# Patient Record
Sex: Female | Born: 1955
Health system: Southern US, Community
[De-identification: ages and names within clinical notes are randomized; demographics above are authoritative.]

## PROBLEM LIST (undated history)

## (undated) DIAGNOSIS — E079 Disorder of thyroid, unspecified: Secondary | ICD-10-CM

## (undated) DIAGNOSIS — E039 Hypothyroidism, unspecified: Secondary | ICD-10-CM

## (undated) DIAGNOSIS — R319 Hematuria, unspecified: Secondary | ICD-10-CM

## (undated) HISTORY — DX: Hypothyroidism, unspecified: E03.9

## (undated) HISTORY — DX: Disorder of thyroid, unspecified: E07.9

## (undated) HISTORY — DX: Hematuria, unspecified: R31.9

## (undated) HISTORY — PX: EYE SURGERY: SHX253

---

## 2001-11-14 ENCOUNTER — Ambulatory Visit (HOSPITAL_COMMUNITY): Admission: RE | Admit: 2001-11-14 | Discharge: 2001-11-14 | Payer: Self-pay | Admitting: Obstetrics and Gynecology

## 2001-11-14 ENCOUNTER — Encounter: Payer: Self-pay | Admitting: Obstetrics and Gynecology

## 2001-11-21 ENCOUNTER — Encounter: Payer: Self-pay | Admitting: Obstetrics and Gynecology

## 2001-11-21 ENCOUNTER — Ambulatory Visit (HOSPITAL_COMMUNITY): Admission: RE | Admit: 2001-11-21 | Discharge: 2001-11-21 | Payer: Self-pay | Admitting: Obstetrics and Gynecology

## 2001-11-26 ENCOUNTER — Other Ambulatory Visit: Admission: RE | Admit: 2001-11-26 | Discharge: 2001-11-26 | Payer: Self-pay | Admitting: Obstetrics and Gynecology

## 2002-12-11 ENCOUNTER — Encounter: Payer: Self-pay | Admitting: Specialist

## 2002-12-11 ENCOUNTER — Ambulatory Visit (HOSPITAL_COMMUNITY): Admission: RE | Admit: 2002-12-11 | Discharge: 2002-12-11 | Payer: Self-pay | Admitting: Specialist

## 2003-12-24 ENCOUNTER — Ambulatory Visit (HOSPITAL_COMMUNITY): Admission: RE | Admit: 2003-12-24 | Discharge: 2003-12-24 | Payer: Self-pay | Admitting: Obstetrics and Gynecology

## 2004-01-06 ENCOUNTER — Ambulatory Visit (HOSPITAL_COMMUNITY): Admission: RE | Admit: 2004-01-06 | Discharge: 2004-01-06 | Payer: Self-pay | Admitting: Obstetrics and Gynecology

## 2005-01-06 ENCOUNTER — Ambulatory Visit (HOSPITAL_COMMUNITY): Admission: RE | Admit: 2005-01-06 | Discharge: 2005-01-06 | Payer: Self-pay | Admitting: Obstetrics and Gynecology

## 2006-03-15 ENCOUNTER — Ambulatory Visit (HOSPITAL_COMMUNITY): Admission: RE | Admit: 2006-03-15 | Discharge: 2006-03-15 | Payer: Self-pay | Admitting: Obstetrics and Gynecology

## 2007-04-15 ENCOUNTER — Ambulatory Visit (HOSPITAL_COMMUNITY): Admission: RE | Admit: 2007-04-15 | Discharge: 2007-04-15 | Payer: Self-pay | Admitting: Obstetrics and Gynecology

## 2008-04-23 ENCOUNTER — Ambulatory Visit (HOSPITAL_COMMUNITY): Admission: RE | Admit: 2008-04-23 | Discharge: 2008-04-23 | Payer: Self-pay | Admitting: Obstetrics and Gynecology

## 2008-06-10 ENCOUNTER — Other Ambulatory Visit: Admission: RE | Admit: 2008-06-10 | Discharge: 2008-06-10 | Payer: Self-pay | Admitting: Obstetrics and Gynecology

## 2009-04-26 ENCOUNTER — Ambulatory Visit (HOSPITAL_COMMUNITY): Admission: RE | Admit: 2009-04-26 | Discharge: 2009-04-26 | Payer: Self-pay | Admitting: Obstetrics and Gynecology

## 2010-04-29 ENCOUNTER — Ambulatory Visit (HOSPITAL_COMMUNITY): Admission: RE | Admit: 2010-04-29 | Discharge: 2010-04-29 | Payer: Self-pay | Admitting: Obstetrics and Gynecology

## 2010-06-15 ENCOUNTER — Other Ambulatory Visit: Admission: RE | Admit: 2010-06-15 | Discharge: 2010-06-15 | Payer: Self-pay | Admitting: Obstetrics and Gynecology

## 2011-02-22 ENCOUNTER — Encounter (HOSPITAL_BASED_OUTPATIENT_CLINIC_OR_DEPARTMENT_OTHER): Payer: BC Managed Care – PPO | Admitting: Internal Medicine

## 2011-02-22 ENCOUNTER — Ambulatory Visit (HOSPITAL_COMMUNITY)
Admission: RE | Admit: 2011-02-22 | Discharge: 2011-02-22 | Disposition: A | Discharge status: Home / Self Care | Payer: BC Managed Care – PPO | Source: Ambulatory Visit | Attending: Internal Medicine | Admitting: Internal Medicine

## 2011-02-22 DIAGNOSIS — Z1211 Encounter for screening for malignant neoplasm of colon: Secondary | ICD-10-CM

## 2011-02-22 DIAGNOSIS — K644 Residual hemorrhoidal skin tags: Secondary | ICD-10-CM

## 2011-03-12 NOTE — Op Note (Signed)
  NAME:  Diana Gregory, Diana Gregory NO.:  [id number]  MEDICAL RECORD NO.:  [id number]           PATIENT TYPE:  O  LOCATION:  DAYP                          FACILITY:  [facility]  PHYSICIAN:  Lionel December, M.D.    DATE OF BIRTH:  1956-08-16  DATE OF PROCEDURE:  02/22/2011 DATE OF DISCHARGE:                              OPERATIVE REPORT   PROCEDURE:  Colonoscopy.  INDICATIONS:  Laquinta is 55 year old Caucasian female who is undergoing average risk screening exam.  This is her first exam.  Procedures were reviewed with the patient.  Informed consent was obtained.  MEDS FOR CONSCIOUS SEDATION: 1. Demerol 25 mg IV. 2. Versed 4 mg IV.  FINDINGS:  Procedure performed in endoscopy suite.  The patient's vital signs and O2 sat were monitored during the procedure and remained stable.  The patient was placed in left lateral recumbent position and rectal examination performed.  No abnormality noted on external or digital exam.  Pentax videoscope was placed through rectum and advanced under vision into sigmoid colon and beyond.  Preparation was excellent. Scope was passed into cecum which was identified by appendiceal orifice and ileocecal valve.  Pictures were taken for the record.  As the scope was withdrawn, colonic mucosa was carefully examined.  It was normal throughout.  Rectal mucosa similarly was normal.  Scope was retroflexed to examine anorectal junction and hemorrhoids noted below the dentate line with focal erythema.  Endoscope was withdrawn.  Withdrawal time was 7 minutes.  The patient tolerated the procedure well.  FINAL DIAGNOSES: 1. Examination performed to cecum. 2. Normal colonoscopy except external hemorrhoids.  RECOMMENDATIONS: 1. Standard instructions given. 2. Yearly Hemoccults. 3. Consider next screening exam in 10 years.          ______________________________ Lionel December, M.D.     NR/MEDQ  D:  02/22/2011  T:  02/22/2011  Job:  [id number]  cc:   Kirk Ruths, M.D. Fax: [phone]  Cyril Mourning, NP  Electronically Signed by Lionel December M.D. on 03/12/2011 01:05:24 PM

## 2011-05-29 ENCOUNTER — Other Ambulatory Visit: Payer: Self-pay | Admitting: Obstetrics and Gynecology

## 2011-05-29 DIAGNOSIS — Z139 Encounter for screening, unspecified: Secondary | ICD-10-CM

## 2011-06-01 ENCOUNTER — Ambulatory Visit (HOSPITAL_COMMUNITY)
Admission: RE | Admit: 2011-06-01 | Discharge: 2011-06-01 | Disposition: A | Discharge status: Home / Self Care | Payer: BC Managed Care – PPO | Source: Ambulatory Visit | Attending: Obstetrics and Gynecology | Admitting: Obstetrics and Gynecology

## 2011-06-01 DIAGNOSIS — Z1231 Encounter for screening mammogram for malignant neoplasm of breast: Secondary | ICD-10-CM | POA: Insufficient documentation

## 2011-06-01 DIAGNOSIS — Z139 Encounter for screening, unspecified: Secondary | ICD-10-CM

## 2011-06-26 ENCOUNTER — Other Ambulatory Visit: Payer: Self-pay | Admitting: Adult Health

## 2011-07-04 ENCOUNTER — Other Ambulatory Visit: Payer: Self-pay | Admitting: Adult Health

## 2011-07-04 ENCOUNTER — Other Ambulatory Visit (HOSPITAL_COMMUNITY)
Admission: RE | Admit: 2011-07-04 | Discharge: 2011-07-04 | Disposition: A | Discharge status: Home / Self Care | Payer: BC Managed Care – PPO | Source: Ambulatory Visit | Attending: Obstetrics and Gynecology | Admitting: Obstetrics and Gynecology

## 2011-07-04 DIAGNOSIS — Z01419 Encounter for gynecological examination (general) (routine) without abnormal findings: Secondary | ICD-10-CM | POA: Insufficient documentation

## 2012-04-30 ENCOUNTER — Other Ambulatory Visit: Payer: Self-pay | Admitting: Adult Health

## 2012-04-30 DIAGNOSIS — Z139 Encounter for screening, unspecified: Secondary | ICD-10-CM

## 2012-06-04 ENCOUNTER — Ambulatory Visit (HOSPITAL_COMMUNITY)
Admission: RE | Admit: 2012-06-04 | Discharge: 2012-06-04 | Disposition: A | Discharge status: Home / Self Care | Payer: BC Managed Care – PPO | Source: Ambulatory Visit | Attending: Adult Health | Admitting: Adult Health

## 2012-06-04 DIAGNOSIS — Z1231 Encounter for screening mammogram for malignant neoplasm of breast: Secondary | ICD-10-CM | POA: Insufficient documentation

## 2012-06-04 DIAGNOSIS — Z139 Encounter for screening, unspecified: Secondary | ICD-10-CM

## 2012-07-19 ENCOUNTER — Other Ambulatory Visit: Payer: Self-pay | Admitting: Adult Health

## 2012-07-19 ENCOUNTER — Other Ambulatory Visit (HOSPITAL_COMMUNITY)
Admission: RE | Admit: 2012-07-19 | Discharge: 2012-07-19 | Disposition: A | Discharge status: Home / Self Care | Payer: BC Managed Care – PPO | Source: Ambulatory Visit | Attending: Obstetrics and Gynecology | Admitting: Obstetrics and Gynecology

## 2012-07-19 DIAGNOSIS — Z1151 Encounter for screening for human papillomavirus (HPV): Secondary | ICD-10-CM | POA: Insufficient documentation

## 2012-07-19 DIAGNOSIS — Z01419 Encounter for gynecological examination (general) (routine) without abnormal findings: Secondary | ICD-10-CM | POA: Insufficient documentation

## 2013-05-12 ENCOUNTER — Other Ambulatory Visit: Payer: Self-pay | Admitting: Adult Health

## 2013-05-12 DIAGNOSIS — Z139 Encounter for screening, unspecified: Secondary | ICD-10-CM

## 2013-06-05 ENCOUNTER — Ambulatory Visit (HOSPITAL_COMMUNITY)
Admission: RE | Admit: 2013-06-05 | Discharge: 2013-06-05 | Disposition: A | Discharge status: Home / Self Care | Payer: BC Managed Care – PPO | Source: Ambulatory Visit | Attending: Adult Health | Admitting: Adult Health

## 2013-06-05 DIAGNOSIS — Z1231 Encounter for screening mammogram for malignant neoplasm of breast: Secondary | ICD-10-CM | POA: Insufficient documentation

## 2013-06-05 DIAGNOSIS — Z139 Encounter for screening, unspecified: Secondary | ICD-10-CM

## 2013-07-28 ENCOUNTER — Ambulatory Visit (INDEPENDENT_AMBULATORY_CARE_PROVIDER_SITE_OTHER): Payer: BC Managed Care – PPO | Admitting: Adult Health

## 2013-07-28 ENCOUNTER — Encounter: Payer: Self-pay | Admitting: Adult Health

## 2013-07-28 ENCOUNTER — Encounter (INDEPENDENT_AMBULATORY_CARE_PROVIDER_SITE_OTHER): Payer: Self-pay

## 2013-07-28 VITALS — BP 110/70 | HR 72 | Ht 63.5 in | Wt 127.0 lb

## 2013-07-28 DIAGNOSIS — Z1212 Encounter for screening for malignant neoplasm of rectum: Secondary | ICD-10-CM

## 2013-07-28 DIAGNOSIS — E039 Hypothyroidism, unspecified: Secondary | ICD-10-CM

## 2013-07-28 DIAGNOSIS — Z01419 Encounter for gynecological examination (general) (routine) without abnormal findings: Secondary | ICD-10-CM

## 2013-07-28 HISTORY — DX: Hypothyroidism, unspecified: E03.9

## 2013-07-28 LAB — HEMOCCULT GUIAC POC 1CARD (OFFICE)

## 2013-07-28 NOTE — Progress Notes (Signed)
Patient ID: Diana Gregory, female   DOB: 1956/10/08, 57 y.o.   MRN: [id number] History of Present Illness: Diana Gregory is a 57 year old white female married in for physical, she had a normal pap with negative HPV 07/19/12.   Current Medications, Allergies, Past Medical History, Past Surgical History, Family History and Social History were reviewed in [employer] record.     Review of Systems: Patient denies any headaches, blurred vision, shortness of breath, chest pain, abdominal pain, problems with bowel movements, urination, or intercourse. No joint pain or mood swings.    Physical Exam:BP 110/70  Pulse 72  Ht 5' 3.5" (1.613 m)  Wt 127 lb (57.607 kg)  BMI 22.14 kg/m2 General:  Well developed, well nourished, no acute distress Skin:  Warm and dry Neck:  Midline trachea, normal thyroid Lungs; Clear to auscultation bilaterally Breast:  No dominant palpable mass, retraction, or nipple discharge Cardiovascular: Regular rate and rhythm Abdomen:  Soft, non tender, no hepatosplenomegaly Pelvic:  External genitalia is normal in appearance.  The vagina is normal in appearance. The cervix is slightly atrophic.  Uterus is felt to be normal size, shape, and contour.  No  adnexal masses or tenderness noted. Rectal: Good sphincter tone, no polyps, or hemorrhoids felt.  Hemoccult negative. Extremities:  No swelling or varicosities noted Psych:  No mood changes, alert and cooperative seems happy   Impression: Yearly gyn exam no pap Hypothyroid     Plan: Physical in 1 year Mammogram yearly Colonoscopy per GI Get fasting labs this week CBC,CMP,TSH and lipids

## 2013-07-28 NOTE — Patient Instructions (Signed)
Physical in 1 year Mammogram yearly Labs in near future colonoscopy as per GI Get flu shot

## 2013-08-12 ENCOUNTER — Other Ambulatory Visit: Payer: Self-pay | Admitting: Adult Health

## 2014-06-10 ENCOUNTER — Other Ambulatory Visit: Payer: Self-pay | Admitting: Adult Health

## 2014-06-10 DIAGNOSIS — Z1231 Encounter for screening mammogram for malignant neoplasm of breast: Secondary | ICD-10-CM

## 2014-06-17 ENCOUNTER — Ambulatory Visit (HOSPITAL_COMMUNITY)
Admission: RE | Admit: 2014-06-17 | Discharge: 2014-06-17 | Disposition: A | Discharge status: Home / Self Care | Payer: BC Managed Care – PPO | Source: Ambulatory Visit | Attending: Adult Health | Admitting: Adult Health

## 2014-06-17 DIAGNOSIS — Z1231 Encounter for screening mammogram for malignant neoplasm of breast: Secondary | ICD-10-CM | POA: Insufficient documentation

## 2014-08-03 ENCOUNTER — Ambulatory Visit (INDEPENDENT_AMBULATORY_CARE_PROVIDER_SITE_OTHER): Payer: BC Managed Care – PPO | Admitting: Adult Health

## 2014-08-03 ENCOUNTER — Encounter: Payer: Self-pay | Admitting: Adult Health

## 2014-08-03 VITALS — BP 98/60 | HR 74 | Ht 64.0 in | Wt 118.0 lb

## 2014-08-03 DIAGNOSIS — E038 Other specified hypothyroidism: Secondary | ICD-10-CM

## 2014-08-03 DIAGNOSIS — Z01419 Encounter for gynecological examination (general) (routine) without abnormal findings: Secondary | ICD-10-CM

## 2014-08-03 DIAGNOSIS — Z1212 Encounter for screening for malignant neoplasm of rectum: Secondary | ICD-10-CM

## 2014-08-03 LAB — HEMOCCULT GUIAC POC 1CARD (OFFICE): FECAL OCCULT BLD: NEGATIVE

## 2014-08-03 LAB — CBC
HCT: 41.3 % (ref 36.0–46.0)
Hemoglobin: 13.7 g/dL (ref 12.0–15.0)
MCH: 30.4 pg (ref 26.0–34.0)
MCHC: 33.2 g/dL (ref 30.0–36.0)
MCV: 91.8 fL (ref 78.0–100.0)
Platelets: 294 10*3/uL (ref 150–400)
RBC: 4.5 MIL/uL (ref 3.87–5.11)
RDW: 13.9 % (ref 11.5–15.5)
WBC: 7.3 10*3/uL (ref 4.0–10.5)

## 2014-08-03 NOTE — Progress Notes (Signed)
Patient ID: Diana Gregory, female   DOB: 07-14-1956, 58 y.o.   MRN: [id number] History of Present Illness: Diana Gregory is a 58 year old white female,married in for a gyn physical.No complaints.Had normal pap with negative HPV 07/19/12.   Current Medications, Allergies, Past Medical History, Past Surgical History, Family History and Social History were reviewed in [employer] record.     Review of Systems: Patient denies any headaches, blurred vision, shortness of breath, chest pain, abdominal pain, problems with bowel movements, urination, or intercourse. No joint pain or mood swings.    Physical Exam:BP 98/60  Pulse 74  Ht 5\' 4"  (1.626 m)  Wt 118 lb (53.524 kg)  BMI 20.24 kg/m2 General:  Well developed, well nourished, no acute distress Skin:  Warm and dry Neck:  Midline trachea, normal thyroid Lungs; Clear to auscultation bilaterally Breast:  No dominant palpable mass, retraction, or nipple discharge Cardiovascular: Regular rate and rhythm Abdomen:  Soft, non tender, no hepatosplenomegaly Pelvic:  External genitalia is normal in appearance.  The vagina has decreased color,moisture and rugae. The cervix is smooth and stenotic.Marland Kitchen  Uterus is felt to be normal size, shape, and contour.  No       adnexal masses or tenderness noted. Rectal: Good sphincter tone, no polyps, or hemorrhoids felt.  Hemoccult negative. Extremities:  No swelling or varicosities noted Psych:  No mood changes,alert and cooperative,seems happy   Impression: Well woman gyn exam no pap Hypothyroid     Plan: Pap and physical in 1 year Mammogram yearly Colonoscopy per Dr Laural Golden Check CBC,CMP,TSH and lipids

## 2014-08-03 NOTE — Addendum Note (Signed)
Addended by: Linton Rump on: 08/03/2014 08:59 AM   Modules accepted: Orders

## 2014-08-03 NOTE — Patient Instructions (Signed)
Pap and physical in 1year Mammogram yearly Get flu shot Colonoscopy per Dr Laural Golden

## 2014-08-04 ENCOUNTER — Telehealth: Payer: Self-pay | Admitting: Adult Health

## 2014-08-04 LAB — COMPREHENSIVE METABOLIC PANEL
ALBUMIN: 4.6 g/dL (ref 3.5–5.2)
ALK PHOS: 67 U/L (ref 39–117)
ALT: 11 U/L (ref 0–35)
AST: 16 U/L (ref 0–37)
BUN: 15 mg/dL (ref 6–23)
CO2: 27 mEq/L (ref 19–32)
Calcium: 9.4 mg/dL (ref 8.4–10.5)
Chloride: 102 mEq/L (ref 96–112)
Creat: 0.73 mg/dL (ref 0.50–1.10)
Glucose, Bld: 93 mg/dL (ref 70–99)
POTASSIUM: 4.6 meq/L (ref 3.5–5.3)
SODIUM: 139 meq/L (ref 135–145)
Total Bilirubin: 0.8 mg/dL (ref 0.2–1.2)
Total Protein: 7.1 g/dL (ref 6.0–8.3)

## 2014-08-04 LAB — LIPID PANEL
CHOLESTEROL: 209 mg/dL — AB (ref 0–200)
HDL: 74 mg/dL (ref >39)
LDL Cholesterol: 120 mg/dL — ABNORMAL HIGH (ref 0–99)
Total CHOL/HDL Ratio: 2.8 Ratio
Triglycerides: 73 mg/dL (ref <150)
VLDL: 15 mg/dL (ref 0–40)

## 2014-08-04 LAB — TSH: TSH: 2.945 u[IU]/mL (ref 0.350–4.500)

## 2014-08-04 NOTE — Telephone Encounter (Signed)
Pt aware of labs which were excellent

## 2014-08-10 ENCOUNTER — Encounter: Payer: Self-pay | Admitting: Adult Health

## 2014-08-17 ENCOUNTER — Other Ambulatory Visit: Payer: Self-pay | Admitting: Adult Health

## 2015-01-14 ENCOUNTER — Telehealth: Payer: Self-pay | Admitting: *Deleted

## 2015-01-14 NOTE — Telephone Encounter (Signed)
Has noticed blood in urine to push fluids tonight and come in in am at 8:30 to be seen and check urine

## 2015-01-15 ENCOUNTER — Encounter: Payer: Self-pay | Admitting: Adult Health

## 2015-01-15 ENCOUNTER — Ambulatory Visit (INDEPENDENT_AMBULATORY_CARE_PROVIDER_SITE_OTHER): Payer: BLUE CROSS/BLUE SHIELD | Admitting: Adult Health

## 2015-01-15 VITALS — BP 100/60 | HR 76 | Ht 64.0 in | Wt 121.5 lb

## 2015-01-15 DIAGNOSIS — R319 Hematuria, unspecified: Secondary | ICD-10-CM

## 2015-01-15 HISTORY — DX: Hematuria, unspecified: R31.9

## 2015-01-15 LAB — POCT URINALYSIS DIPSTICK
Glucose, UA: NEGATIVE
Leukocytes, UA: NEGATIVE
NITRITE UA: NEGATIVE
Protein, UA: NEGATIVE

## 2015-01-15 MED ORDER — NITROFURANTOIN MONOHYD MACRO 100 MG PO CAPS: 100.0000 mg | ORAL_CAPSULE | Freq: Two times a day (BID) | ORAL | Status: DC

## 2015-01-15 NOTE — Progress Notes (Signed)
Subjective:     Patient ID: Diana Gregory, female   DOB: 1955-10-31, 59 y.o.   MRN: [id number]  HPI Diana Gregory is a 59 year old white female complaining of having seen blood in her urine, no frequency or pain.This started yesterday.  Review of Systems  +blood in urine,all other systems negative Reviewed past medical,surgical, social and family history. Reviewed medications and allergies.     Objective:   Physical Exam BP 100/60 mmHg  Pulse 76  Ht 5\' 4"  (1.626 m)  Wt 121 lb 8 oz (55.112 kg)  BMI 20.85 kg/m2urine 1+ blood, No CVAT, Skin warm and dry.Pelvic: external genitalia is normal in appearance no lesions, vagina: scant discharge without odor,urethra has no lesions or masses noted, cervix:smooth, uterus: normal size, shape and contour, non tender, no masses felt, adnexa: no masses or tenderness noted. Bladder is non tender and no masses felt.    Assessment:     Blood in urine    Plan:     Rx macrobid 1 bid x 7 days #14 no refills Push fluids UA C&S sent Review handout on UTI

## 2015-01-15 NOTE — Patient Instructions (Signed)
Urinary Tract Infection Urinary tract infections (UTIs) can develop anywhere along your urinary tract. Your urinary tract is your body's drainage system for removing wastes and extra water. Your urinary tract includes two kidneys, two ureters, a bladder, and a urethra. Your kidneys are a pair of bean-shaped organs. Each kidney is about the size of your fist. They are located below your ribs, one on each side of your spine. CAUSES Infections are caused by microbes, which are microscopic organisms, including fungi, viruses, and bacteria. These organisms are so small that they can only be seen through a microscope. Bacteria are the microbes that most commonly cause UTIs. SYMPTOMS  Symptoms of UTIs may vary by age and gender of the patient and by the location of the infection. Symptoms in young women typically include a frequent and intense urge to urinate and a painful, burning feeling in the bladder or urethra during urination. Older women and men are more likely to be tired, shaky, and weak and have muscle aches and abdominal pain. A fever may mean the infection is in your kidneys. Other symptoms of a kidney infection include pain in your back or sides below the ribs, nausea, and vomiting. DIAGNOSIS To diagnose a UTI, your caregiver will ask you about your symptoms. Your caregiver also will ask to provide a urine sample. The urine sample will be tested for bacteria and white blood cells. White blood cells are made by your body to help fight infection. TREATMENT  Typically, UTIs can be treated with medication. Because most UTIs are caused by a bacterial infection, they usually can be treated with the use of antibiotics. The choice of antibiotic and length of treatment depend on your symptoms and the type of bacteria causing your infection. HOME CARE INSTRUCTIONS  If you were prescribed antibiotics, take them exactly as your caregiver instructs you. Finish the medication even if you feel better after you  have only taken some of the medication.  Drink enough water and fluids to keep your urine clear or pale yellow.  Avoid caffeine, tea, and carbonated beverages. They tend to irritate your bladder.  Empty your bladder often. Avoid holding urine for long periods of time.  Empty your bladder before and after sexual intercourse.  After a bowel movement, women should cleanse from front to back. Use each tissue only once. SEEK MEDICAL CARE IF:   You have back pain.  You develop a fever.  Your symptoms do not begin to resolve within 3 days. SEEK IMMEDIATE MEDICAL CARE IF:   You have severe back pain or lower abdominal pain.  You develop chills.  You have nausea or vomiting.  You have continued burning or discomfort with urination. MAKE SURE YOU:   Understand these instructions.  Will watch your condition.  Will get help right away if you are not doing well or get worse. Document Released: 07/05/2005 Document Revised: 03/26/2012 Document Reviewed: 11/03/2011 [facility] Patient Information 2015 [place], [place]. This information is not intended to replace advice given to you by your health care provider. Make sure you discuss any questions you have with your health care provider. Push fluids

## 2015-01-16 LAB — URINALYSIS, ROUTINE W REFLEX MICROSCOPIC
BILIRUBIN UA: NEGATIVE
Glucose, UA: NEGATIVE
Ketones, UA: NEGATIVE
LEUKOCYTES UA: NEGATIVE
Nitrite, UA: NEGATIVE
Protein, UA: NEGATIVE
SPEC GRAV UA: 1.01 (ref 1.005–1.030)
Urobilinogen, Ur: 0.2 mg/dL (ref 0.2–1.0)
pH, UA: 7 (ref 5.0–7.5)

## 2015-01-16 LAB — MICROSCOPIC EXAMINATION
Bacteria, UA: NONE SEEN
Casts: NONE SEEN /lpf
Epithelial Cells (non renal): NONE SEEN /hpf (ref 0–10)

## 2015-01-16 LAB — URINE CULTURE

## 2015-06-29 ENCOUNTER — Other Ambulatory Visit: Payer: Self-pay | Admitting: Adult Health

## 2015-06-29 DIAGNOSIS — Z1231 Encounter for screening mammogram for malignant neoplasm of breast: Secondary | ICD-10-CM

## 2015-07-01 ENCOUNTER — Ambulatory Visit (HOSPITAL_COMMUNITY)
Admission: RE | Admit: 2015-07-01 | Discharge: 2015-07-01 | Disposition: A | Discharge status: Home / Self Care | Payer: BLUE CROSS/BLUE SHIELD | Source: Ambulatory Visit | Attending: Adult Health | Admitting: Adult Health

## 2015-07-01 DIAGNOSIS — Z1231 Encounter for screening mammogram for malignant neoplasm of breast: Secondary | ICD-10-CM | POA: Diagnosis present

## 2015-08-05 ENCOUNTER — Ambulatory Visit (INDEPENDENT_AMBULATORY_CARE_PROVIDER_SITE_OTHER): Payer: BLUE CROSS/BLUE SHIELD | Admitting: Adult Health

## 2015-08-05 ENCOUNTER — Other Ambulatory Visit (HOSPITAL_COMMUNITY)
Admission: RE | Admit: 2015-08-05 | Discharge: 2015-08-05 | Disposition: A | Discharge status: Home / Self Care | Payer: BLUE CROSS/BLUE SHIELD | Source: Ambulatory Visit | Attending: Adult Health | Admitting: Adult Health

## 2015-08-05 ENCOUNTER — Encounter: Payer: Self-pay | Admitting: Adult Health

## 2015-08-05 VITALS — BP 96/60 | HR 78 | Ht 63.5 in | Wt 121.5 lb

## 2015-08-05 DIAGNOSIS — E038 Other specified hypothyroidism: Secondary | ICD-10-CM

## 2015-08-05 DIAGNOSIS — Z01419 Encounter for gynecological examination (general) (routine) without abnormal findings: Secondary | ICD-10-CM | POA: Diagnosis not present

## 2015-08-05 DIAGNOSIS — Z1151 Encounter for screening for human papillomavirus (HPV): Secondary | ICD-10-CM | POA: Insufficient documentation

## 2015-08-05 DIAGNOSIS — Z1212 Encounter for screening for malignant neoplasm of rectum: Secondary | ICD-10-CM

## 2015-08-05 LAB — HEMOCCULT GUIAC POC 1CARD (OFFICE): FECAL OCCULT BLD: NEGATIVE

## 2015-08-05 MED ORDER — SYNTHROID 50 MCG PO TABS: ORAL_TABLET | ORAL | Status: DC

## 2015-08-05 NOTE — Progress Notes (Signed)
Patient ID: Diana Gregory, female   DOB: [birth year], 59 y.o.   MRN: [id number] History of Present Illness: Diana Gregory is a 59 year old white female,married in for a well woman gyn exam and pap.No complaints. PCP is [facility].  Current Medications, Allergies, Past Medical History, Past Surgical History, Family History and Social History were reviewed in [employer] record.     Review of Systems: Patient denies any headaches, hearing loss, fatigue, blurred vision, shortness of breath, chest pain, abdominal pain, problems with bowel movements, urination, or intercourse. No joint pain or mood swings.    Physical Exam:BP 96/60 mmHg  Pulse 78  Ht 5' 3.5" (1.613 m)  Wt 121 lb 8 oz (55.112 kg)  BMI 21.18 kg/m2 General:  Well developed, well nourished, no acute distress Skin:  Warm and dry Neck:  Midline trachea, normal thyroid, good ROM, no lymphadenopathy Lungs; Clear to auscultation bilaterally Breast:  No dominant palpable mass, retraction, or nipple discharge Cardiovascular: Regular rate and rhythm Abdomen:  Soft, non tender, no hepatosplenomegaly Pelvic:  External genitalia is normal in appearance, no lesions.  The vagina is pale with loss of rugae and moisture. Urethra has no lesions or masses. The cervix is smooth and stenotic at os, pap with HPV performed.  Uterus is felt to be normal size, shape, and contour.  No adnexal masses or tenderness noted.Bladder is non tender, no masses felt. Rectal: Good sphincter tone, no polyps, or hemorrhoids felt.  Hemoccult negative.mild rectocele Extremities/musculoskeletal:  No swelling or varicosities noted, no clubbing or cyanosis Psych:  No mood changes, alert and cooperative,seems happy   Impression: Well woman gyn exam and pap Hypothyroid     Plan: TSH today Refilled synthroid 50 mcg #30 take 1 daily with 11 refills Physical in 1 year, pap in 3 if normal Mammogram yearly Colonoscopy per GI Get flu shot

## 2015-08-05 NOTE — Patient Instructions (Signed)
Physical in 1 year Mammogram yearly Colonoscopy per GI 

## 2015-08-06 ENCOUNTER — Telehealth: Payer: Self-pay | Admitting: Adult Health

## 2015-08-06 LAB — TSH: TSH: 0.532 u[IU]/mL (ref 0.450–4.500)

## 2015-08-06 NOTE — Telephone Encounter (Signed)
Left message TSH normal continue current dose

## 2015-08-09 LAB — CYTOLOGY - PAP

## 2016-01-12 DIAGNOSIS — E039 Hypothyroidism, unspecified: Secondary | ICD-10-CM | POA: Diagnosis not present

## 2016-01-27 DIAGNOSIS — L821 Other seborrheic keratosis: Secondary | ICD-10-CM | POA: Diagnosis not present

## 2016-06-21 ENCOUNTER — Other Ambulatory Visit: Payer: Self-pay | Admitting: Adult Health

## 2016-06-21 DIAGNOSIS — Z1231 Encounter for screening mammogram for malignant neoplasm of breast: Secondary | ICD-10-CM

## 2016-07-05 ENCOUNTER — Ambulatory Visit (HOSPITAL_COMMUNITY)
Admission: RE | Admit: 2016-07-05 | Discharge: 2016-07-05 | Disposition: A | Discharge status: Home / Self Care | Payer: BLUE CROSS/BLUE SHIELD | Source: Ambulatory Visit | Attending: Adult Health | Admitting: Adult Health

## 2016-07-05 DIAGNOSIS — Z1231 Encounter for screening mammogram for malignant neoplasm of breast: Secondary | ICD-10-CM

## 2016-07-20 DIAGNOSIS — L818 Other specified disorders of pigmentation: Secondary | ICD-10-CM | POA: Diagnosis not present

## 2016-07-20 DIAGNOSIS — B078 Other viral warts: Secondary | ICD-10-CM | POA: Diagnosis not present

## 2016-08-08 ENCOUNTER — Ambulatory Visit (INDEPENDENT_AMBULATORY_CARE_PROVIDER_SITE_OTHER): Payer: BLUE CROSS/BLUE SHIELD | Admitting: Adult Health

## 2016-08-08 ENCOUNTER — Encounter: Payer: Self-pay | Admitting: Adult Health

## 2016-08-08 VITALS — BP 92/60 | HR 84 | Ht 63.5 in | Wt 124.5 lb

## 2016-08-08 DIAGNOSIS — Z1212 Encounter for screening for malignant neoplasm of rectum: Secondary | ICD-10-CM

## 2016-08-08 DIAGNOSIS — E038 Other specified hypothyroidism: Secondary | ICD-10-CM

## 2016-08-08 DIAGNOSIS — Z01419 Encounter for gynecological examination (general) (routine) without abnormal findings: Secondary | ICD-10-CM

## 2016-08-08 LAB — HEMOCCULT GUIAC POC 1CARD (OFFICE): FECAL OCCULT BLD: NEGATIVE

## 2016-08-08 NOTE — Patient Instructions (Addendum)
Physical in 1 year,pap in 2019 Mammogram yearly Colonoscopy per GI

## 2016-08-08 NOTE — Progress Notes (Signed)
Patient ID: Diana Gregory, female   DOB: [birth year], 60 y.o.   MRN: [id number] History of Present Illness: Diana Gregory is a 60 year old white female, married in for well woman gyn exam,she had a normal pap with negative HPV 08/05/15. PCP is [facility].    Current Medications, Allergies, Past Medical History, Past Surgical History, Family History and Social History were reviewed in [employer] record.     Review of Systems: Patient denies any headaches, hearing loss, fatigue, blurred vision, shortness of breath, chest pain, abdominal pain, problems with bowel movements, urination, or intercourse. No joint pain or mood swings.    Physical Exam:BP 92/60 (BP Location: Left Arm, Patient Position: Sitting, Cuff Size: Normal)   Pulse 84   Ht 5' 3.5" (1.613 m)   Wt 124 lb 8 oz (56.5 kg)   BMI 21.71 kg/m  General:  Well developed, well nourished, no acute distress Skin:  Warm and dry Neck:  Midline trachea, normal thyroid, good ROM, no lymphadenopathy Lungs; Clear to auscultation bilaterally Breast:  No dominant palpable mass, retraction, or nipple discharge Cardiovascular: Regular rate and rhythm Abdomen:  Soft, non tender, no hepatosplenomegaly Pelvic:  External genitalia is normal in appearance, no lesions.  The vagina is normal in appearance. Urethra has no lesions or masses. The cervix is smooth.  Uterus is felt to be normal size, shape, and contour.  No adnexal masses or tenderness noted.Bladder is non tender, no masses felt. Rectal: Good sphincter tone, no polyps, or hemorrhoids felt.  Hemoccult negative. Extremities/musculoskeletal:  No swelling or varicosities noted, no clubbing or cyanosis Psych:  No mood changes, alert and cooperative,seems happy PHQ 2 score 0. She declines the flu shot.  Impression:  1. Well woman exam with routine gynecological exam   2. Other specified hypothyroidism      Plan: Check CBC,CMP,TSH and lipids,A1c and vitamin D Continue  synthroid 50 mcg, has refill  Physical in 1 year,pap in 2019 Mammogram yearly Colonoscopy per GI

## 2016-08-09 DIAGNOSIS — E038 Other specified hypothyroidism: Secondary | ICD-10-CM | POA: Diagnosis not present

## 2016-08-09 DIAGNOSIS — Z01419 Encounter for gynecological examination (general) (routine) without abnormal findings: Secondary | ICD-10-CM | POA: Diagnosis not present

## 2016-08-10 ENCOUNTER — Telehealth: Payer: Self-pay | Admitting: Adult Health

## 2016-08-10 LAB — COMPREHENSIVE METABOLIC PANEL
ALBUMIN: 4.6 g/dL (ref 3.6–4.8)
ALK PHOS: 76 IU/L (ref 39–117)
ALT: 15 IU/L (ref 0–32)
AST: 18 IU/L (ref 0–40)
Albumin/Globulin Ratio: 1.9 (ref 1.2–2.2)
BILIRUBIN TOTAL: 0.6 mg/dL (ref 0.0–1.2)
BUN / CREAT RATIO: 15 (ref 12–28)
BUN: 12 mg/dL (ref 8–27)
CHLORIDE: 101 mmol/L (ref 96–106)
CO2: 26 mmol/L (ref 18–29)
Calcium: 9.5 mg/dL (ref 8.7–10.3)
Creatinine, Ser: 0.8 mg/dL (ref 0.57–1.00)
GFR calc non Af Amer: 80 mL/min/{1.73_m2} (ref >59)
GFR, EST AFRICAN AMERICAN: 92 mL/min/{1.73_m2} (ref >59)
GLOBULIN, TOTAL: 2.4 g/dL (ref 1.5–4.5)
Glucose: 96 mg/dL (ref 65–99)
Potassium: 5 mmol/L (ref 3.5–5.2)
SODIUM: 140 mmol/L (ref 134–144)
TOTAL PROTEIN: 7 g/dL (ref 6.0–8.5)

## 2016-08-10 LAB — HEMOGLOBIN A1C
ESTIMATED AVERAGE GLUCOSE: 111 mg/dL
HEMOGLOBIN A1C: 5.5 % (ref 4.8–5.6)

## 2016-08-10 LAB — VITAMIN D 25 HYDROXY (VIT D DEFICIENCY, FRACTURES): Vit D, 25-Hydroxy: 35.1 ng/mL (ref 30.0–100.0)

## 2016-08-10 LAB — CBC
HEMATOCRIT: 39.8 % (ref 34.0–46.6)
HEMOGLOBIN: 13.5 g/dL (ref 11.1–15.9)
MCH: 30.8 pg (ref 26.6–33.0)
MCHC: 33.9 g/dL (ref 31.5–35.7)
MCV: 91 fL (ref 79–97)
Platelets: 291 10*3/uL (ref 150–379)
RBC: 4.38 x10E6/uL (ref 3.77–5.28)
RDW: 13.4 % (ref 12.3–15.4)
WBC: 6.5 10*3/uL (ref 3.4–10.8)

## 2016-08-10 LAB — TSH: TSH: 0.78 u[IU]/mL (ref 0.450–4.500)

## 2016-08-10 LAB — LIPID PANEL
CHOL/HDL RATIO: 3 ratio (ref 0.0–4.4)
Cholesterol, Total: 223 mg/dL — ABNORMAL HIGH (ref 100–199)
HDL: 75 mg/dL (ref >39)
LDL Calculated: 130 mg/dL — ABNORMAL HIGH (ref 0–99)
Triglycerides: 91 mg/dL (ref 0–149)
VLDL CHOLESTEROL CAL: 18 mg/dL (ref 5–40)

## 2016-08-10 MED ORDER — SYNTHROID 50 MCG PO TABS: ORAL_TABLET | ORAL | 11 refills | Status: DC

## 2016-08-10 NOTE — Telephone Encounter (Signed)
Left message that labs good, refilled synthroid

## 2017-06-06 ENCOUNTER — Other Ambulatory Visit: Payer: Self-pay | Admitting: Adult Health

## 2017-06-06 DIAGNOSIS — Z1231 Encounter for screening mammogram for malignant neoplasm of breast: Secondary | ICD-10-CM

## 2017-07-09 ENCOUNTER — Ambulatory Visit (HOSPITAL_COMMUNITY)
Admission: RE | Admit: 2017-07-09 | Discharge: 2017-07-09 | Disposition: A | Discharge status: Home / Self Care | Payer: BLUE CROSS/BLUE SHIELD | Source: Ambulatory Visit | Attending: Adult Health | Admitting: Adult Health

## 2017-07-09 DIAGNOSIS — Z1231 Encounter for screening mammogram for malignant neoplasm of breast: Secondary | ICD-10-CM | POA: Diagnosis not present

## 2017-08-10 ENCOUNTER — Encounter: Payer: Self-pay | Admitting: Adult Health

## 2017-08-10 ENCOUNTER — Ambulatory Visit (INDEPENDENT_AMBULATORY_CARE_PROVIDER_SITE_OTHER): Payer: BLUE CROSS/BLUE SHIELD | Admitting: Adult Health

## 2017-08-10 VITALS — BP 112/62 | HR 73 | Ht 63.25 in | Wt 124.0 lb

## 2017-08-10 DIAGNOSIS — Z1211 Encounter for screening for malignant neoplasm of colon: Secondary | ICD-10-CM | POA: Diagnosis not present

## 2017-08-10 DIAGNOSIS — Z01419 Encounter for gynecological examination (general) (routine) without abnormal findings: Secondary | ICD-10-CM

## 2017-08-10 DIAGNOSIS — Z1212 Encounter for screening for malignant neoplasm of rectum: Secondary | ICD-10-CM

## 2017-08-10 DIAGNOSIS — E039 Hypothyroidism, unspecified: Secondary | ICD-10-CM

## 2017-08-10 LAB — HEMOCCULT GUIAC POC 1CARD (OFFICE): Fecal Occult Blood, POC: NEGATIVE

## 2017-08-10 MED ORDER — SYNTHROID 50 MCG PO TABS: ORAL_TABLET | ORAL | 11 refills | Status: DC

## 2017-08-10 NOTE — Patient Instructions (Signed)
Pap and physical in 1 year Mammogram yearly Colonoscopy per GI

## 2017-08-10 NOTE — Progress Notes (Signed)
Patient ID: Diana Gregory, female   DOB: 02-Mar-1956, 61 y.o.   MRN: [id number] History of Present Illness:  Diana Gregory is a 61 year old white female, married, PM in for well woman gyn exam, she had normal pap with negative HPV 08/05/15. PCP is Dr Hilma Favors.  Current Medications, Allergies, Past Medical History, Past Surgical History, Family History and Social History were reviewed in [employer] record.     Review of Systems:  Patient denies any headaches, hearing loss, fatigue, blurred vision, shortness of breath, chest pain, abdominal pain, problems with bowel movements, urination, or intercourse(not having sex). No joint pain or mood swings.   Physical Exam:BP 112/62 (BP Location: Left Arm, Patient Position: Sitting, Cuff Size: Normal)   Pulse 73   Ht 5' 3.25" (1.607 m)   Wt 124 lb (56.2 kg)   BMI 21.79 kg/m  General:  Well developed, well nourished, no acute distress Skin:  Warm and dry Neck:  Midline trachea, normal thyroid, good ROM, no lymphadenopathy,nor carotid bruits heard  Lungs; Clear to auscultation bilaterally Breast:  No dominant palpable mass, retraction, or nipple discharge Cardiovascular: Regular rate and rhythm Abdomen:  Soft, non tender, no hepatosplenomegaly Pelvic:  External genitalia is normal in appearance, no lesions.  The vagina is normal in appearance for age with loss of color, moisture and rugae. Urethra has no lesions or masses. The cervix is smooth.  Uterus is felt to be normal size, shape, and contour.  No adnexal masses or tenderness noted.Bladder is non tender, no masses felt. Rectal: Good sphincter tone, no polyps, or hemorrhoids felt.  Hemoccult negative. Extremities/musculoskeletal:  No swelling or varicosities noted, no clubbing or cyanosis Psych:  No mood changes, alert and cooperative,seems happy PHQ 2 score 0.  Impression:  1. Well woman exam with routine gynecological exam   2. Hypothyroidism, unspecified type   3. Screening  for colorectal cancer      Plan: Check CBC,CMP,TSH and lipids Meds ordered this encounter  Medications  . SYNTHROID 50 MCG tablet    Sig: TAKE ONE (1) TABLET EACH DAY    Dispense:  30 tablet    Refill:  11    NEED TO SIGN DISPENSE AS WRITTEN    Order Specific Question:   Supervising Provider    Answer:   Tania Ade H [[id number]]   Pap and physical in 1 year Mammogram yearly Colonoscopy per GI

## 2017-08-11 LAB — COMPREHENSIVE METABOLIC PANEL
A/G RATIO: 1.9 (ref 1.2–2.2)
ALT: 14 IU/L (ref 0–32)
AST: 19 IU/L (ref 0–40)
Albumin: 4.7 g/dL (ref 3.6–4.8)
Alkaline Phosphatase: 79 IU/L (ref 39–117)
BUN/Creatinine Ratio: 16 (ref 12–28)
BUN: 12 mg/dL (ref 8–27)
Bilirubin Total: 0.6 mg/dL (ref 0.0–1.2)
CALCIUM: 9.4 mg/dL (ref 8.7–10.3)
CO2: 24 mmol/L (ref 20–29)
Chloride: 105 mmol/L (ref 96–106)
Creatinine, Ser: 0.73 mg/dL (ref 0.57–1.00)
GFR, EST AFRICAN AMERICAN: 102 mL/min/{1.73_m2} (ref >59)
GFR, EST NON AFRICAN AMERICAN: 89 mL/min/{1.73_m2} (ref >59)
Globulin, Total: 2.5 g/dL (ref 1.5–4.5)
Glucose: 96 mg/dL (ref 65–99)
POTASSIUM: 4.4 mmol/L (ref 3.5–5.2)
SODIUM: 143 mmol/L (ref 134–144)
TOTAL PROTEIN: 7.2 g/dL (ref 6.0–8.5)

## 2017-08-11 LAB — CBC
HEMATOCRIT: 39.3 % (ref 34.0–46.6)
Hemoglobin: 13.6 g/dL (ref 11.1–15.9)
MCH: 31.8 pg (ref 26.6–33.0)
MCHC: 34.6 g/dL (ref 31.5–35.7)
MCV: 92 fL (ref 79–97)
Platelets: 299 10*3/uL (ref 150–379)
RBC: 4.28 x10E6/uL (ref 3.77–5.28)
RDW: 13.4 % (ref 12.3–15.4)
WBC: 6.6 10*3/uL (ref 3.4–10.8)

## 2017-08-11 LAB — LIPID PANEL
CHOL/HDL RATIO: 2.9 ratio (ref 0.0–4.4)
Cholesterol, Total: 215 mg/dL — ABNORMAL HIGH (ref 100–199)
HDL: 75 mg/dL (ref >39)
LDL CALC: 127 mg/dL — AB (ref 0–99)
Triglycerides: 63 mg/dL (ref 0–149)
VLDL Cholesterol Cal: 13 mg/dL (ref 5–40)

## 2017-08-11 LAB — TSH: TSH: 0.466 u[IU]/mL (ref 0.450–4.500)

## 2017-08-14 ENCOUNTER — Telehealth: Payer: Self-pay | Admitting: Adult Health

## 2017-08-14 NOTE — Telephone Encounter (Signed)
Pt aware labs all good.

## 2018-06-19 ENCOUNTER — Other Ambulatory Visit: Payer: Self-pay | Admitting: Adult Health

## 2018-06-19 DIAGNOSIS — Z1231 Encounter for screening mammogram for malignant neoplasm of breast: Secondary | ICD-10-CM

## 2018-07-11 ENCOUNTER — Ambulatory Visit (HOSPITAL_COMMUNITY): Payer: BLUE CROSS/BLUE SHIELD

## 2018-07-18 ENCOUNTER — Ambulatory Visit (HOSPITAL_COMMUNITY)
Admission: RE | Admit: 2018-07-18 | Discharge: 2018-07-18 | Disposition: A | Discharge status: Home / Self Care | Payer: BLUE CROSS/BLUE SHIELD | Source: Ambulatory Visit | Attending: Adult Health | Admitting: Adult Health

## 2018-07-18 DIAGNOSIS — Z1231 Encounter for screening mammogram for malignant neoplasm of breast: Secondary | ICD-10-CM

## 2018-07-29 DIAGNOSIS — H5202 Hypermetropia, left eye: Secondary | ICD-10-CM | POA: Diagnosis not present

## 2018-07-29 DIAGNOSIS — H52223 Regular astigmatism, bilateral: Secondary | ICD-10-CM | POA: Diagnosis not present

## 2018-07-29 DIAGNOSIS — H5211 Myopia, right eye: Secondary | ICD-10-CM | POA: Diagnosis not present

## 2018-08-26 ENCOUNTER — Other Ambulatory Visit: Payer: Self-pay | Admitting: Adult Health

## 2018-09-24 ENCOUNTER — Other Ambulatory Visit: Payer: Self-pay | Admitting: Women's Health

## 2018-09-26 ENCOUNTER — Other Ambulatory Visit: Payer: Self-pay | Admitting: Adult Health

## 2018-10-18 DIAGNOSIS — H25013 Cortical age-related cataract, bilateral: Secondary | ICD-10-CM | POA: Diagnosis not present

## 2018-10-18 DIAGNOSIS — H2511 Age-related nuclear cataract, right eye: Secondary | ICD-10-CM | POA: Diagnosis not present

## 2018-10-18 DIAGNOSIS — H2513 Age-related nuclear cataract, bilateral: Secondary | ICD-10-CM | POA: Diagnosis not present

## 2018-10-18 DIAGNOSIS — H18413 Arcus senilis, bilateral: Secondary | ICD-10-CM | POA: Diagnosis not present

## 2018-10-18 DIAGNOSIS — H02831 Dermatochalasis of right upper eyelid: Secondary | ICD-10-CM | POA: Diagnosis not present

## 2018-10-25 DIAGNOSIS — H2511 Age-related nuclear cataract, right eye: Secondary | ICD-10-CM | POA: Diagnosis not present

## 2018-10-25 DIAGNOSIS — H2512 Age-related nuclear cataract, left eye: Secondary | ICD-10-CM | POA: Diagnosis not present

## 2018-11-08 DIAGNOSIS — H2512 Age-related nuclear cataract, left eye: Secondary | ICD-10-CM | POA: Diagnosis not present

## 2019-03-24 ENCOUNTER — Other Ambulatory Visit: Payer: Self-pay

## 2019-03-24 ENCOUNTER — Other Ambulatory Visit: Payer: BLUE CROSS/BLUE SHIELD

## 2019-03-24 DIAGNOSIS — R6889 Other general symptoms and signs: Secondary | ICD-10-CM | POA: Diagnosis not present

## 2019-03-24 DIAGNOSIS — Z20822 Contact with and (suspected) exposure to covid-19: Secondary | ICD-10-CM

## 2019-03-27 ENCOUNTER — Telehealth: Payer: Self-pay

## 2019-03-27 LAB — NOVEL CORONAVIRUS, NAA: SARS-CoV-2, NAA: NOT DETECTED

## 2019-03-27 NOTE — Telephone Encounter (Signed)
Pt. Given negative COVID 19 results, verbalizes understanding.

## 2019-04-24 ENCOUNTER — Other Ambulatory Visit: Payer: Self-pay | Admitting: Adult Health

## 2019-06-25 ENCOUNTER — Other Ambulatory Visit (HOSPITAL_COMMUNITY): Payer: Self-pay | Admitting: Adult Health

## 2019-06-25 DIAGNOSIS — Z1231 Encounter for screening mammogram for malignant neoplasm of breast: Secondary | ICD-10-CM

## 2019-07-21 ENCOUNTER — Ambulatory Visit (HOSPITAL_COMMUNITY)
Admission: RE | Admit: 2019-07-21 | Discharge: 2019-07-21 | Disposition: A | Discharge status: Home / Self Care | Payer: BC Managed Care – PPO | Source: Ambulatory Visit | Attending: Adult Health | Admitting: Adult Health

## 2019-07-21 ENCOUNTER — Other Ambulatory Visit: Payer: Self-pay

## 2019-07-21 DIAGNOSIS — Z1231 Encounter for screening mammogram for malignant neoplasm of breast: Secondary | ICD-10-CM | POA: Diagnosis not present

## 2019-07-31 DIAGNOSIS — Z23 Encounter for immunization: Secondary | ICD-10-CM | POA: Diagnosis not present

## 2019-07-31 DIAGNOSIS — Z6821 Body mass index (BMI) 21.0-21.9, adult: Secondary | ICD-10-CM | POA: Diagnosis not present

## 2019-07-31 DIAGNOSIS — E039 Hypothyroidism, unspecified: Secondary | ICD-10-CM | POA: Diagnosis not present

## 2019-07-31 DIAGNOSIS — Z1389 Encounter for screening for other disorder: Secondary | ICD-10-CM | POA: Diagnosis not present

## 2019-07-31 DIAGNOSIS — Z0000 Encounter for general adult medical examination without abnormal findings: Secondary | ICD-10-CM | POA: Diagnosis not present

## 2019-09-17 ENCOUNTER — Other Ambulatory Visit: Payer: Self-pay

## 2019-09-17 DIAGNOSIS — Z20828 Contact with and (suspected) exposure to other viral communicable diseases: Secondary | ICD-10-CM | POA: Diagnosis not present

## 2019-09-17 DIAGNOSIS — Z20822 Contact with and (suspected) exposure to covid-19: Secondary | ICD-10-CM

## 2019-09-17 DIAGNOSIS — Z9189 Other specified personal risk factors, not elsewhere classified: Secondary | ICD-10-CM | POA: Diagnosis not present

## 2019-09-18 LAB — NOVEL CORONAVIRUS, NAA: SARS-CoV-2, NAA: NOT DETECTED

## 2019-09-19 ENCOUNTER — Telehealth: Payer: Self-pay | Admitting: Family Medicine

## 2019-09-19 NOTE — Telephone Encounter (Signed)
Negative COVID results given. Patient results "NOT Detected." Caller expressed understanding. ° °

## 2019-11-20 ENCOUNTER — Other Ambulatory Visit: Payer: Self-pay | Admitting: Adult Health

## 2019-12-07 ENCOUNTER — Ambulatory Visit: Payer: BC Managed Care – PPO | Attending: Internal Medicine

## 2019-12-07 DIAGNOSIS — Z23 Encounter for immunization: Secondary | ICD-10-CM | POA: Insufficient documentation

## 2019-12-07 NOTE — Progress Notes (Signed)
   Covid-19 Vaccination Clinic  Name:  Diana Gregory    MRN: [id number] DOB: Aug 14, 1956  12/07/2019  Diana Gregory was observed post Covid-19 immunization for 15 minutes without incidence. She was provided with Vaccine Information Sheet and instruction to access the V-Safe system.   Diana Gregory was instructed to call 911 with any severe reactions post vaccine: Marland Kitchen Difficulty breathing  . Swelling of your face and throat  . A fast heartbeat  . A bad rash all over your body  . Dizziness and weakness    Immunizations Administered    Name Date Dose VIS Date Route   Moderna COVID-19 Vaccine 12/07/2019  1:45 PM 0.5 mL 09/09/2019 Intramuscular   Manufacturer: Moderna   Lot: [id number]   [id number]

## 2020-01-10 ENCOUNTER — Ambulatory Visit: Payer: BC Managed Care – PPO | Attending: Internal Medicine

## 2020-01-10 DIAGNOSIS — Z23 Encounter for immunization: Secondary | ICD-10-CM

## 2020-01-10 NOTE — Progress Notes (Signed)
   Covid-19 Vaccination Clinic  Name:  Diana Gregory    MRN: [id number] DOB: [birth year]  01/10/2020  [name] was observed post Covid-19 immunization for 15 minutes without incident. She was provided with Vaccine Information Sheet and instruction to access the V-Safe system.   [name] was instructed to call 911 with any severe reactions post vaccine: Marland Kitchen Difficulty breathing  . Swelling of face and throat  . A fast heartbeat  . A bad rash all over body  . Dizziness and weakness   Immunizations Administered    Name Date Dose VIS Date Route   Moderna COVID-19 Vaccine 01/10/2020  9:46 AM 0.5 mL 09/09/2019 Intramuscular   Manufacturer: Moderna   Lot: [id number]   [id number]

## 2020-03-06 ENCOUNTER — Other Ambulatory Visit: Payer: Self-pay

## 2020-03-06 ENCOUNTER — Ambulatory Visit
Admission: EM | Admit: 2020-03-06 | Discharge: 2020-03-06 | Disposition: A | Discharge status: Home / Self Care | Payer: BC Managed Care – PPO | Attending: Emergency Medicine | Admitting: Emergency Medicine

## 2020-03-06 DIAGNOSIS — J029 Acute pharyngitis, unspecified: Secondary | ICD-10-CM

## 2020-03-06 DIAGNOSIS — R0989 Other specified symptoms and signs involving the circulatory and respiratory systems: Secondary | ICD-10-CM

## 2020-03-06 MED ORDER — CETIRIZINE HCL 10 MG PO TABS: 10.0000 mg | ORAL_TABLET | Freq: Every day | ORAL | 0 refills | Status: AC

## 2020-03-06 MED ORDER — FLUTICASONE PROPIONATE 50 MCG/ACT NA SUSP: 2.0000 | Freq: Every day | NASAL | 0 refills | Status: DC

## 2020-03-06 NOTE — ED Provider Notes (Signed)
[place]   [id number] 03/06/20 Arrival Time: [id number]   CC: Sore throat  SUBJECTIVE: History from: patient.  Diana Gregory is a 64 y.o. female who presents with mild runny nose, sore throat, and PND x 1 week.  Denies sick exposure to COVID, flu or strep.  Has tried OTC medications without relief.  Symptoms are made worse with swallowing, but tolerating liquids and secretions without difficulty.  Reports previous symptoms in the past.   Complains of mild LAD.  Denies fever, chills, fatigue, sinus pain, cough, SOB, wheezing, chest pain, nausea, changes in bowel or bladder habits.    Denies tobacco use or chewing tobacco  ROS: As per HPI.  All other pertinent ROS negative.     Past Medical History:  Diagnosis Date  . Blood in urine 01/15/2015  . Hypothyroid 07/28/2013  . Thyroid disease    History reviewed. No pertinent surgical history. No Known Allergies No current facility-administered medications on file prior to encounter.   Current Outpatient Medications on File Prior to Encounter  Medication Sig Dispense Refill  . cholecalciferol (VITAMIN D) 1000 UNITS tablet Take 2,000 Units by mouth daily.    Marland Kitchen SYNTHROID 50 MCG tablet TAKE ONE (1) TABLET BY MOUTH EVERY DAY 30 tablet 6   Social History   Socioeconomic History  . Marital status: Married    Spouse name: Not on file  . Number of children: Not on file  . Years of education: Not on file  . Highest education level: Not on file  Occupational History  . Not on file  Tobacco Use  . Smoking status: Never Smoker  . Smokeless tobacco: Never Used  Substance and Sexual Activity  . Alcohol use: Yes    Alcohol/week: 10.0 standard drinks    Types: 10 Cans of beer per week    Comment: occassional  . Drug use: No  . Sexual activity: Not Currently    Birth control/protection: Post-menopausal  Other Topics Concern  . Not on file  Social History Narrative  . Not on file   Social Determinants of Health   Financial  Resource Strain:   . Difficulty of Paying Living Expenses:   Food Insecurity:   . Worried About Charity fundraiser in the Last Year:   . Arboriculturist in the Last Year:   Transportation Needs:   . Film/video editor (Medical):   Marland Kitchen Lack of Transportation (Non-Medical):   Physical Activity:   . Days of Exercise per Week:   . Minutes of Exercise per Session:   Stress:   . Feeling of Stress :   Social Connections:   . Frequency of Communication with Friends and Family:   . Frequency of Social Gatherings with Friends and Family:   . Attends Religious Services:   . Active Member of Clubs or Organizations:   . Attends Archivist Meetings:   Marland Kitchen Marital Status:   Intimate Partner Violence:   . Fear of Current or Ex-Partner:   . Emotionally Abused:   Marland Kitchen Physically Abused:   . Sexually Abused:    Family History  Problem Relation Age of Onset  . Hyperlipidemia Mother   . Heart disease Father   . Cancer Father        prostate  . Hyperlipidemia Father   . Cancer Maternal Aunt 24       breast   . Heart disease Paternal Grandmother   . Heart disease Paternal Grandfather     OBJECTIVE:  Vitals:   03/06/20 1244  BP: 106/76  Pulse: 95  Resp: 20  Temp: 98.1 F (36.7 C)  SpO2: 97%     General appearance: alert; well-appearing, nontoxic; speaking in full sentences and tolerating own secretions HEENT: NCAT; Ears: EACs clear, TMs pearly gray; Eyes: PERRL.  EOM grossly intact.Nose: nares patent without rhinorrhea, Throat: oropharynx clear, tonsils non erythematous or enlarged, oropharynx erythematous, uvula midline  Neck: supple without LAD Lungs: unlabored respirations, symmetrical air entry; cough: absent; no respiratory distress; CTAB Heart: regular rate and rhythm.  Skin: warm and dry Psychological: alert and cooperative; normal mood and affect   ASSESSMENT & PLAN:  1. Sore throat   2. Runny nose     Meds ordered this encounter  Medications  . cetirizine  (ZYRTEC) 10 MG tablet    Sig: Take 1 tablet (10 mg total) by mouth daily.    Dispense:  30 tablet    Refill:  0    Order Specific Question:   Supervising Provider    Answer:   Raylene Everts [id number]  . fluticasone (FLONASE) 50 MCG/ACT nasal spray    Sig: Place 2 sprays into both nostrils daily.    Dispense:  16 g    Refill:  0    Order Specific Question:   Supervising Provider    Answer:   Raylene Everts [id number]    Get plenty of rest and push fluids Symptoms may be secondary to post nasal drip/ allergies zyrtec for nasal congestion, runny nose, and/or sore throat flonase for nasal congestion and runny nose Use medications daily for symptom relief Use OTC medications like ibuprofen or tylenol as needed fever or pain Anticipate follow up with PCP next week for recheck and to ensure symptoms symptoms are improving Call or go to the ED if you have any new or worsening symptoms such as fever, cough, shortness of breath, chest tightness, chest pain, turning blue, changes in mental status, etc...   Reviewed expectations re: course of current medical issues. Questions answered. Outlined signs and symptoms indicating need for more acute intervention. Patient verbalized understanding. After Visit Summary given.         Lestine Box, PA-C 03/06/20 1259

## 2020-03-06 NOTE — Discharge Instructions (Signed)
Get plenty of rest and push fluids Symptoms may be secondary to post nasal drip/ allergies zyrtec for nasal congestion, runny nose, and/or sore throat flonase for nasal congestion and runny nose Use medications daily for symptom relief Use OTC medications like ibuprofen or tylenol as needed fever or pain Anticipate follow up with PCP next week for recheck and to ensure symptoms symptoms are improving Call or go to the ED if you have any new or worsening symptoms such as fever, cough, shortness of breath, chest tightness, chest pain, turning blue, changes in mental status, etc..Marland Kitchen

## 2020-03-06 NOTE — ED Triage Notes (Signed)
Pt presents with c/o swollen lymph nodes and sore throat for past week

## 2020-06-22 ENCOUNTER — Telehealth: Payer: Self-pay

## 2020-06-22 DIAGNOSIS — E039 Hypothyroidism, unspecified: Secondary | ICD-10-CM

## 2020-06-22 MED ORDER — SYNTHROID 50 MCG PO TABS: ORAL_TABLET | ORAL | 0 refills | Status: DC

## 2020-06-22 NOTE — Telephone Encounter (Signed)
New message    1. Which medications need to be refilled? (please list name of each medication and dose if known) synthroid  50 mg  2. Which pharmacy/location (including street and city if local pharmacy) is medication to be sent to? [facility] on [address]

## 2020-06-22 NOTE — Telephone Encounter (Signed)
Pt plans on having TSH checked. For future prescriptions, she wants to use [facility] on [address]. Pt will get prescription from [place] [place]

## 2020-06-22 NOTE — Telephone Encounter (Signed)
Left message refilled synthroid and put lab order in for TSH ,needs to be checked

## 2020-06-22 NOTE — Telephone Encounter (Signed)
F/u   Please call [facility] on [address] to refill synthroid 50 mg  will not refill medication until someone calls them back.   Asking for a call back from the nurse.

## 2020-06-23 DIAGNOSIS — E039 Hypothyroidism, unspecified: Secondary | ICD-10-CM | POA: Diagnosis not present

## 2020-06-24 ENCOUNTER — Other Ambulatory Visit: Payer: Self-pay | Admitting: Adult Health

## 2020-06-24 LAB — TSH: TSH: 1.54 u[IU]/mL (ref 0.450–4.500)

## 2020-06-24 MED ORDER — SYNTHROID 50 MCG PO TABS: ORAL_TABLET | ORAL | 12 refills | Status: DC

## 2020-06-29 ENCOUNTER — Other Ambulatory Visit (HOSPITAL_COMMUNITY): Payer: Self-pay | Admitting: Adult Health

## 2020-06-29 DIAGNOSIS — Z1231 Encounter for screening mammogram for malignant neoplasm of breast: Secondary | ICD-10-CM

## 2020-07-23 ENCOUNTER — Ambulatory Visit (HOSPITAL_COMMUNITY): Payer: BC Managed Care – PPO

## 2020-07-23 ENCOUNTER — Other Ambulatory Visit: Payer: Self-pay

## 2020-07-23 ENCOUNTER — Ambulatory Visit (HOSPITAL_COMMUNITY)
Admission: RE | Admit: 2020-07-23 | Discharge: 2020-07-23 | Disposition: A | Discharge status: Home / Self Care | Payer: Medicare HMO | Source: Ambulatory Visit | Attending: Adult Health | Admitting: Adult Health

## 2020-07-23 DIAGNOSIS — Z1231 Encounter for screening mammogram for malignant neoplasm of breast: Secondary | ICD-10-CM | POA: Diagnosis not present

## 2020-07-26 ENCOUNTER — Other Ambulatory Visit: Payer: Self-pay | Admitting: Adult Health

## 2020-08-19 DIAGNOSIS — D225 Melanocytic nevi of trunk: Secondary | ICD-10-CM | POA: Diagnosis not present

## 2020-08-19 DIAGNOSIS — Z1283 Encounter for screening for malignant neoplasm of skin: Secondary | ICD-10-CM | POA: Diagnosis not present

## 2020-12-28 DIAGNOSIS — H5202 Hypermetropia, left eye: Secondary | ICD-10-CM | POA: Diagnosis not present

## 2021-02-01 ENCOUNTER — Encounter (INDEPENDENT_AMBULATORY_CARE_PROVIDER_SITE_OTHER): Payer: Self-pay | Admitting: *Deleted

## 2021-02-23 IMAGING — MG DIGITAL SCREENING BILAT W/ TOMO W/ CAD
8 series · 9 of 24 positions shown · non-contrast
Comparison: Previous exam(s).

CLINICAL DATA: Screening.

EXAM:
DIGITAL SCREENING BILATERAL MAMMOGRAM WITH TOMO AND CAD

[L MLO synth-2D]
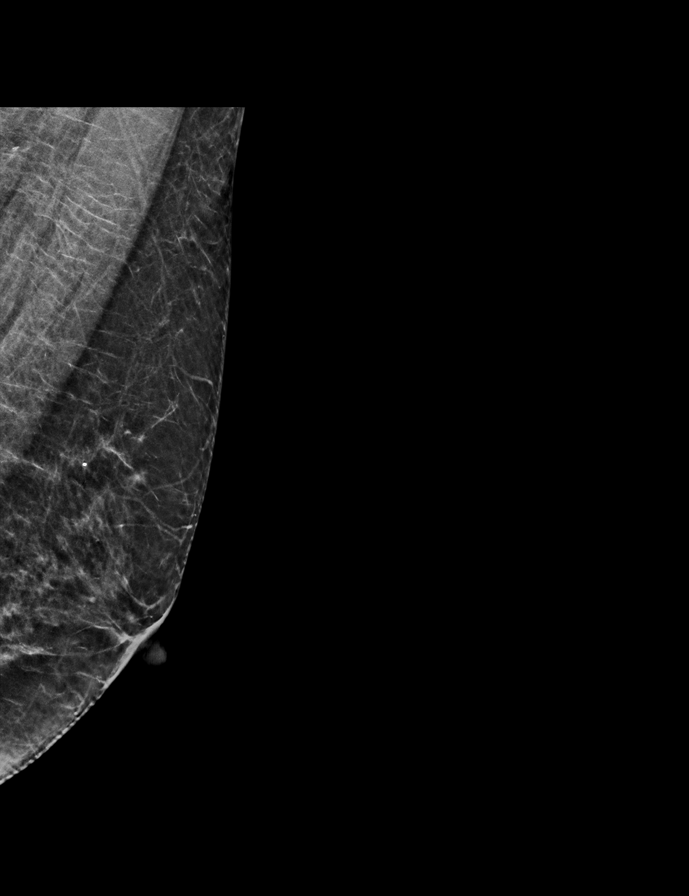

[R MLO synth-2D]
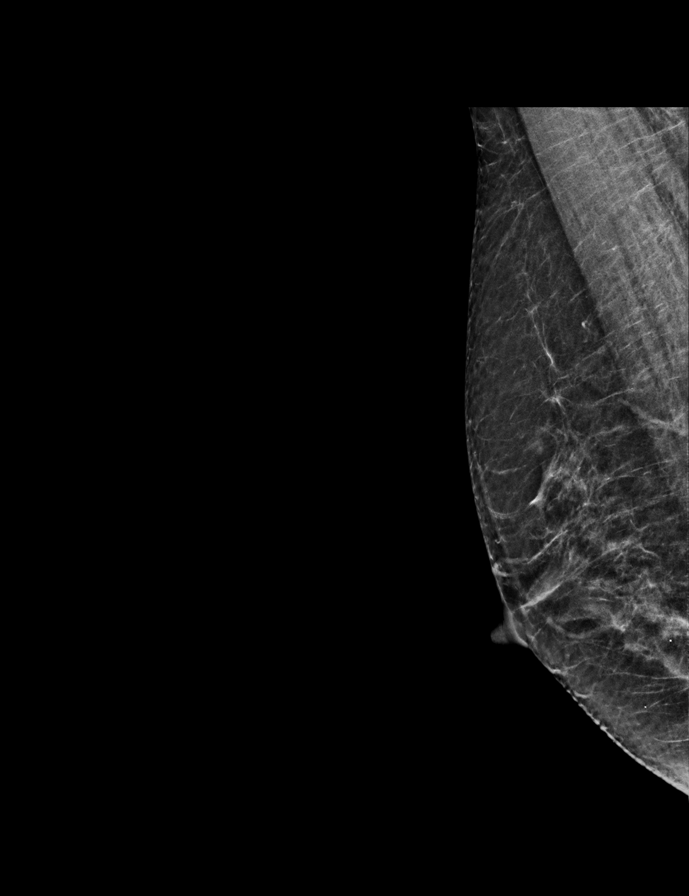

[R CC synth-2D]
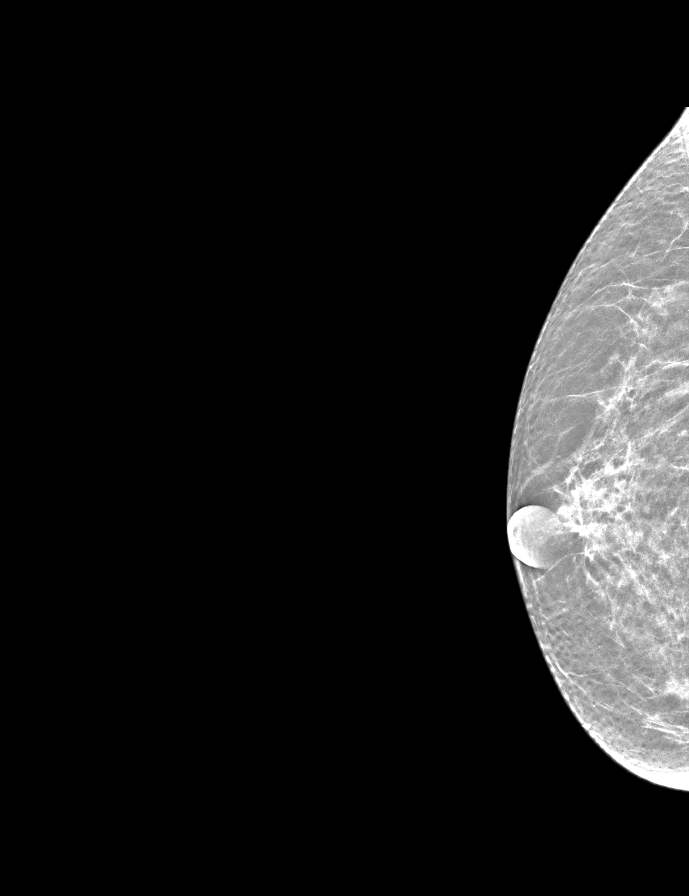

[L CC synth-2D]
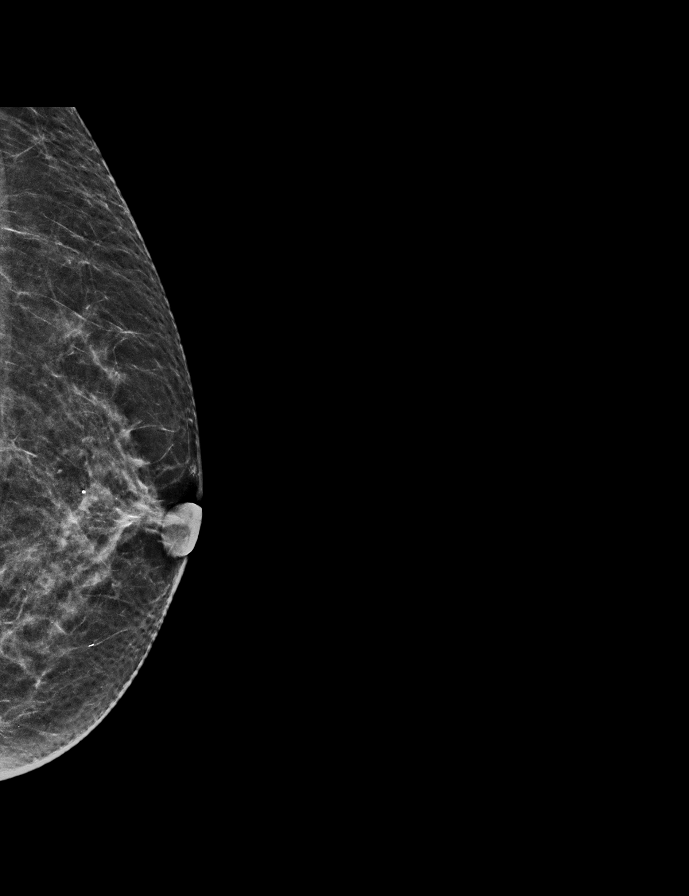

[R CC tomo · 2 of 47 frames shown]
[frame 16/47]
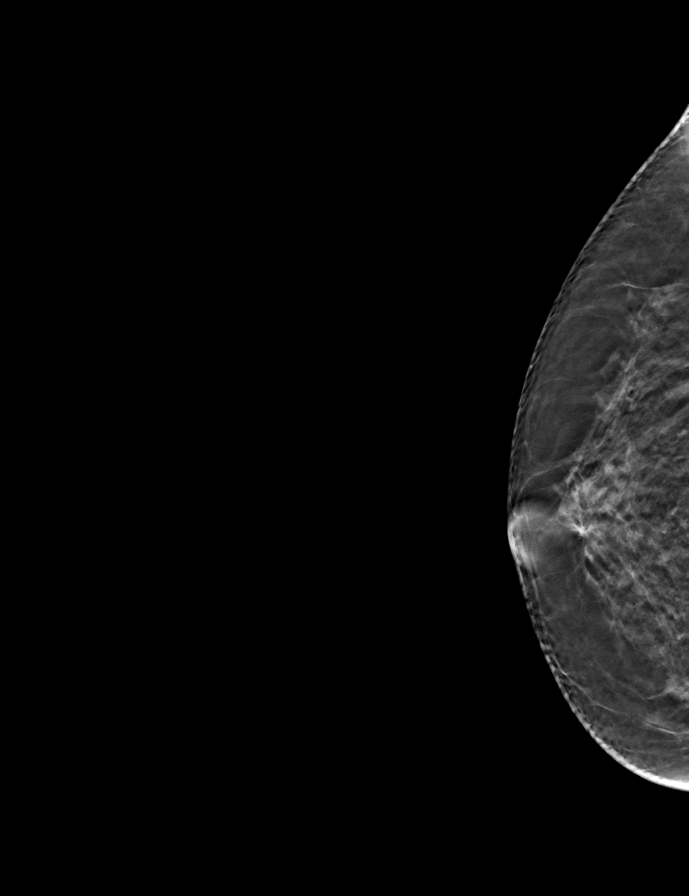
[frame 24/47]
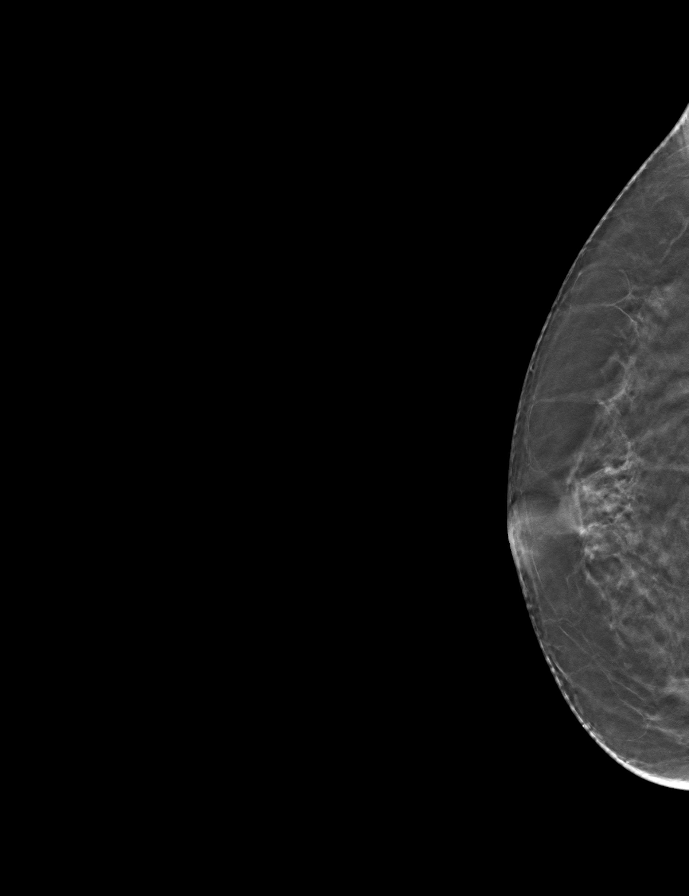

[L CC tomo · tomo slice 27/53.0]
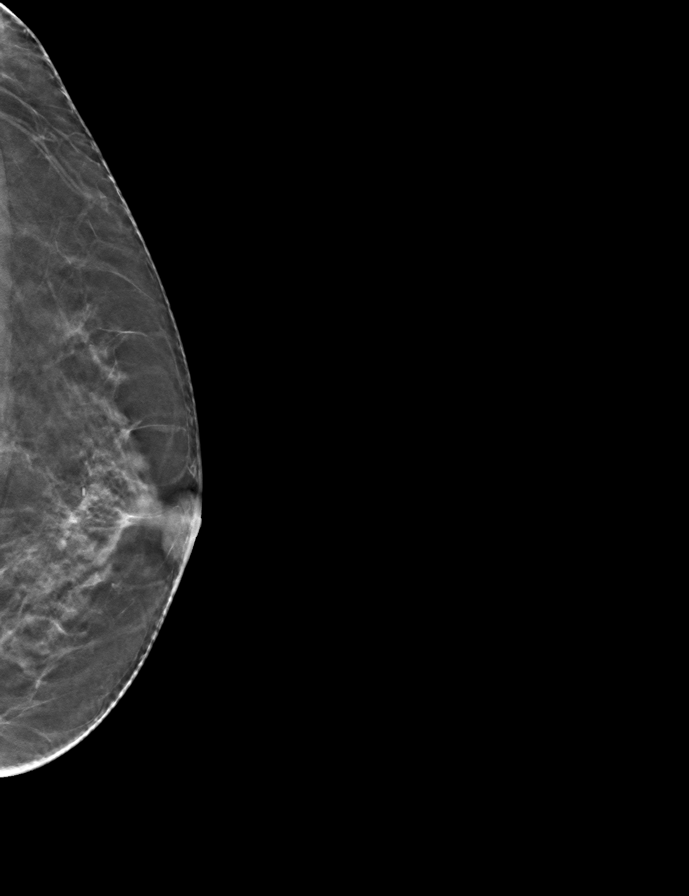

[L MLO tomo · tomo slice 25/50.0]
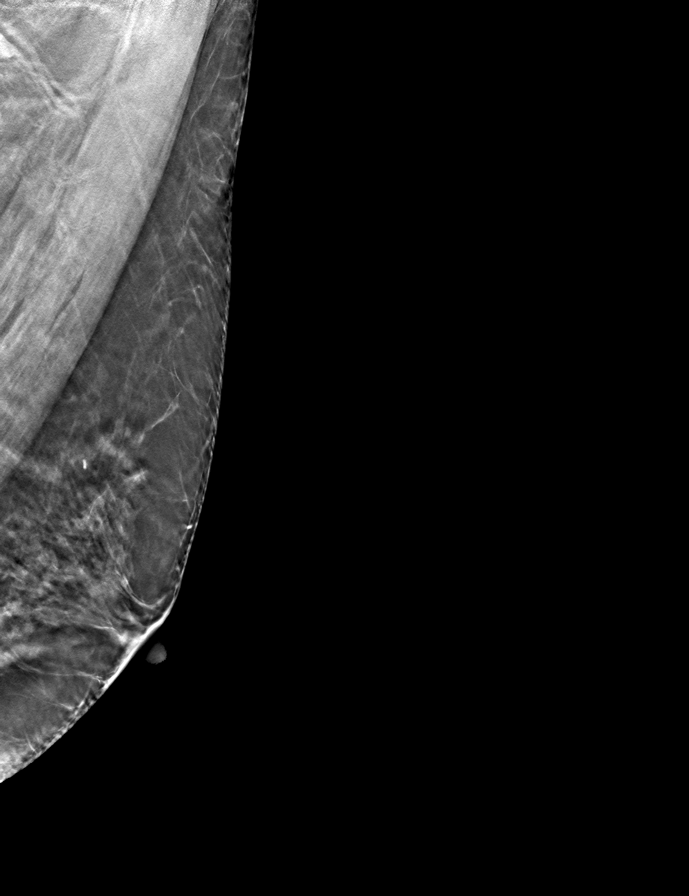

[R MLO tomo · tomo slice 26/51.0]
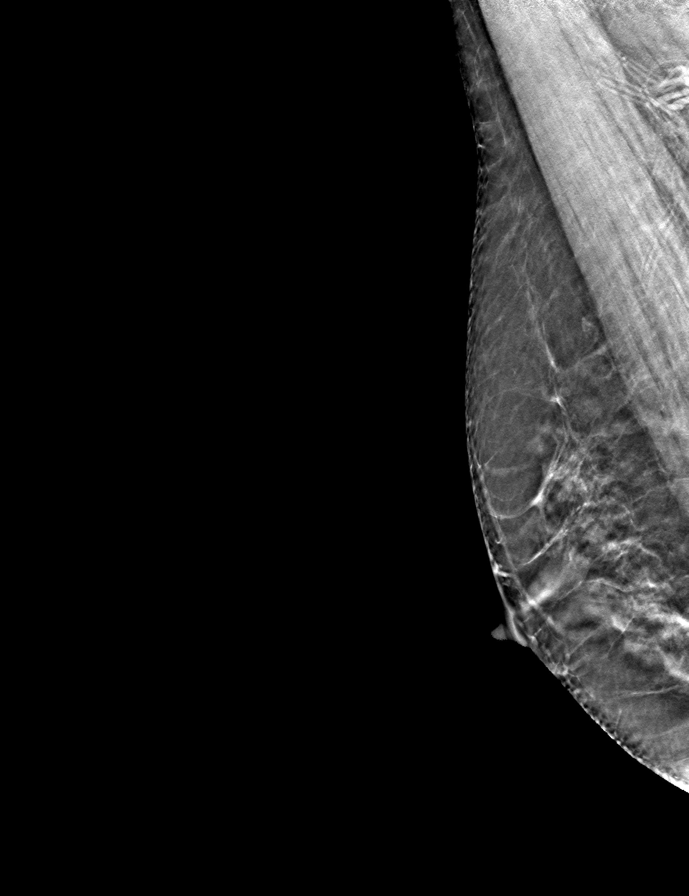

[9 of 24 positions shown; findings below may reference images not displayed]

ACR Breast Density Category b: There are scattered areas of
fibroglandular density.
FINDINGS: There are no findings suspicious for malignancy. Images were
processed with CAD.
IMPRESSION: No mammographic evidence of malignancy. A result letter of this
screening mammogram will be mailed directly to the patient.

RECOMMENDATION:
Screening mammogram in one year. (Code:[id number])

BI-RADS CATEGORY  1: Negative.

## 2021-03-14 DIAGNOSIS — Z681 Body mass index (BMI) 19 or less, adult: Secondary | ICD-10-CM | POA: Diagnosis not present

## 2021-03-14 DIAGNOSIS — J019 Acute sinusitis, unspecified: Secondary | ICD-10-CM | POA: Diagnosis not present

## 2021-03-14 DIAGNOSIS — G4709 Other insomnia: Secondary | ICD-10-CM | POA: Diagnosis not present

## 2021-05-19 DIAGNOSIS — Z6821 Body mass index (BMI) 21.0-21.9, adult: Secondary | ICD-10-CM | POA: Diagnosis not present

## 2021-05-19 DIAGNOSIS — Z1331 Encounter for screening for depression: Secondary | ICD-10-CM | POA: Diagnosis not present

## 2021-05-19 DIAGNOSIS — Z0000 Encounter for general adult medical examination without abnormal findings: Secondary | ICD-10-CM | POA: Diagnosis not present

## 2021-05-19 DIAGNOSIS — E039 Hypothyroidism, unspecified: Secondary | ICD-10-CM | POA: Diagnosis not present

## 2021-05-23 ENCOUNTER — Other Ambulatory Visit (HOSPITAL_COMMUNITY): Payer: Self-pay | Admitting: Physician Assistant

## 2021-05-23 DIAGNOSIS — E2839 Other primary ovarian failure: Secondary | ICD-10-CM

## 2021-05-26 ENCOUNTER — Other Ambulatory Visit (INDEPENDENT_AMBULATORY_CARE_PROVIDER_SITE_OTHER): Payer: Self-pay

## 2021-05-26 ENCOUNTER — Telehealth (INDEPENDENT_AMBULATORY_CARE_PROVIDER_SITE_OTHER): Payer: Self-pay

## 2021-05-26 ENCOUNTER — Encounter (INDEPENDENT_AMBULATORY_CARE_PROVIDER_SITE_OTHER): Payer: Self-pay

## 2021-05-26 DIAGNOSIS — Z1211 Encounter for screening for malignant neoplasm of colon: Secondary | ICD-10-CM

## 2021-05-26 MED ORDER — CLENPIQ 10-3.5-12 MG-GM -GM/160ML PO SOLN: 1.0000 | Freq: Once | ORAL | 0 refills | Status: AC

## 2021-05-26 NOTE — Telephone Encounter (Signed)
Referring MD/PCP: Hilma Favors  Procedure: tcs  Reason/Indication:  screening  Has patient had this procedure before?  yes  If so, when, by whom and where?  10 yrs ago  Is there a family history of colon cancer?  no  Who?  What age when diagnosed?    Is patient diabetic? If yes, Type 1 or Type 2   no      Does patient have prosthetic heart valve or mechanical valve?  no  Do you have a pacemaker/defibrillator?  no  Has patient ever had endocarditis/atrial fibrillation? no  Does patient use oxygen? no  Has patient had joint replacement within last 12 months?  no  Is patient constipated or do they take laxatives? no  Does patient have a history of alcohol/drug use?  no  Have you had a stroke/heart attack last 6 mths? no  Do you take medicine for weight loss?  no  For female patients,: do you still have your menstrual cycle? N/A  Is patient on blood thinner such as Coumadin, Plavix and/or Aspirin? no  Medications: Synthroid 50 mcg daily  Allergies: nkda  Medication Adjustment per Dr Laural Golden none  Procedure date & time: 06/23/21 at 9:30

## 2021-05-26 NOTE — Telephone Encounter (Signed)
LeighAnn Avrey Hyser, CMA  

## 2021-06-23 ENCOUNTER — Encounter (HOSPITAL_COMMUNITY)
Admission: RE | Disposition: A | Discharge status: Home / Self Care | Payer: Self-pay | Source: Home / Self Care | Attending: Internal Medicine

## 2021-06-23 ENCOUNTER — Other Ambulatory Visit: Payer: Self-pay

## 2021-06-23 ENCOUNTER — Encounter (HOSPITAL_COMMUNITY): Payer: Self-pay | Admitting: Internal Medicine

## 2021-06-23 ENCOUNTER — Ambulatory Visit (HOSPITAL_COMMUNITY)
Admission: RE | Admit: 2021-06-23 | Discharge: 2021-06-23 | Disposition: A | Discharge status: Home / Self Care | Payer: Medicare HMO | Attending: Internal Medicine | Admitting: Internal Medicine

## 2021-06-23 DIAGNOSIS — K644 Residual hemorrhoidal skin tags: Secondary | ICD-10-CM | POA: Insufficient documentation

## 2021-06-23 DIAGNOSIS — K648 Other hemorrhoids: Secondary | ICD-10-CM | POA: Diagnosis not present

## 2021-06-23 DIAGNOSIS — Z79890 Hormone replacement therapy (postmenopausal): Secondary | ICD-10-CM | POA: Insufficient documentation

## 2021-06-23 DIAGNOSIS — Z1211 Encounter for screening for malignant neoplasm of colon: Secondary | ICD-10-CM | POA: Diagnosis not present

## 2021-06-23 HISTORY — PX: COLONOSCOPY: SHX5424

## 2021-06-23 LAB — HM COLONOSCOPY

## 2021-06-23 SURGERY — COLONOSCOPY
Anesthesia: Moderate Sedation

## 2021-06-23 MED ORDER — MEPERIDINE HCL 50 MG/ML IJ SOLN
INTRAMUSCULAR | Status: DC | PRN
  Administered 2021-06-23 (×2): 25 mg

## 2021-06-23 MED ORDER — MIDAZOLAM HCL 5 MG/5ML IJ SOLN
INTRAMUSCULAR | Status: DC | PRN
  Administered 2021-06-23 (×2): 1 mg via INTRAVENOUS
  Administered 2021-06-23 (×2): 2 mg via INTRAVENOUS

## 2021-06-23 MED ORDER — STERILE WATER FOR IRRIGATION IR SOLN
Status: DC | PRN
  Administered 2021-06-23: 200 mL

## 2021-06-23 MED ORDER — MEPERIDINE HCL 50 MG/ML IJ SOLN
INTRAMUSCULAR | Status: AC
  Filled 2021-06-23: qty 1

## 2021-06-23 MED ORDER — SODIUM CHLORIDE 0.9 % IV SOLN: INTRAVENOUS | Status: DC

## 2021-06-23 MED ORDER — MIDAZOLAM HCL 5 MG/5ML IJ SOLN
INTRAMUSCULAR | Status: AC
  Filled 2021-06-23: qty 10

## 2021-06-23 NOTE — H&P (Signed)
Diana Gregory is an 65 y.o. female.   Chief Complaint: Patient is here for colonoscopy. HPI: Patient is 65 year old Caucasian female who is here for screening colonoscopy.  She denies abdominal pain change in bowel habits or rectal bleeding.  Last colonoscopy was normal in May 2012. She does not take aspirin or anticoagulants. Family history is negative for CRC.  Past Medical History:  Diagnosis Date    01/15/2015   Hypothyroid 07/28/2013   Normal colonoscopy in May 2012.     Past Surgical History:  Procedure Laterality Date   EYE SURGERY Bilateral     Family History  Problem Relation Age of Onset   Hyperlipidemia Mother    Heart disease Father    Cancer Father        prostate   Hyperlipidemia Father    Cancer Maternal Aunt 53       breast    Heart disease Paternal Grandmother    Heart disease Paternal Grandfather    Social History:  reports that she has never smoked. She has never used smokeless tobacco. She reports current alcohol use of about 10.0 standard drinks per week. She reports that she does not use drugs.  Allergies: No Known Allergies  Medications Prior to Admission  Medication Sig Dispense Refill   cetirizine (ZYRTEC) 10 MG tablet Take 1 tablet (10 mg total) by mouth daily. (Patient taking differently: Take 10 mg by mouth daily as needed for allergies.) 30 tablet 0   cholecalciferol (VITAMIN D) 1000 UNITS tablet Take 2,000 Units by mouth daily.     Multiple Vitamins-Minerals (MULTIVITAMIN WITH MINERALS) tablet Take 1 tablet by mouth daily.     SYNTHROID 50 MCG tablet TAKE ONE TABLET ONCE DAILY (Patient taking differently: Take 50 mcg by mouth daily before breakfast. TAKE ONE TABLET ONCE DAILY) 30 tablet 12    No results found for this or any previous visit (from the past 48 hour(s)). No results found.  Review of Systems  Blood pressure 127/74, pulse 90, temperature 98.7 F (37.1 C), temperature source Oral, resp. rate 16, height '5\' 3"'$  (1.6 m), weight 55.3  kg, SpO2 98 %. Physical Exam HENT:     Mouth/Throat:     Mouth: Mucous membranes are moist.     Pharynx: Oropharynx is clear.  Eyes:     General: No scleral icterus.    Conjunctiva/sclera: Conjunctivae normal.  Cardiovascular:     Rate and Rhythm: Normal rate and regular rhythm.     Heart sounds: Normal heart sounds. No murmur heard. Pulmonary:     Effort: Pulmonary effort is normal.     Breath sounds: Normal breath sounds.  Abdominal:     General: There is no distension.     Palpations: Abdomen is soft. There is no mass.     Tenderness: There is no abdominal tenderness.  Neurological:     Mental Status: She is alert.     Assessment/Plan  Average risk screening colonoscopy  Hildred Laser, MD 06/23/2021, 9:17 AM

## 2021-06-23 NOTE — Discharge Instructions (Addendum)
Resume usual medications and diet. Consider taking Metamucil 3 to 4 g daily if you are prone to constipation. No driving for 24 hours. Next screening exam in 10 years.

## 2021-06-23 NOTE — Op Note (Signed)
[facility] Patient Name: Diana Gregory Procedure Date: 06/23/2021 8:56 AM MRN: [id number] Date of Birth: November 19, 1955 Attending MD: Hildred Laser , MD CSN: [id number] Age: 65 Admit Type: Outpatient Procedure:                Colonoscopy Indications:              Screening for colorectal malignant neoplasm Providers:                Hildred Laser, MD, Lambert Mody, Aram Candela Referring MD:             Halford Chessman, MD Medicines:                Meperidine 50 mg IV, Midazolam 6 mg IV Complications:            No immediate complications. Estimated Blood Loss:     Estimated blood loss: none. Procedure:                Pre-Anesthesia Assessment:                           - Prior to the procedure, a History and Physical                            was performed, and patient medications and                            allergies were reviewed. The patient's tolerance of                            previous anesthesia was also reviewed. The risks                            and benefits of the procedure and the sedation                            options and risks were discussed with the patient.                            All questions were answered, and informed consent                            was obtained. Prior Anticoagulants: The patient has                            taken no previous anticoagulant or antiplatelet                            agents. ASA Grade Assessment: I - A normal, healthy                            patient. After reviewing the risks and benefits,                            the patient was deemed in satisfactory condition to  undergo the procedure.                           After obtaining informed consent, the colonoscope                            was passed under direct vision. Throughout the                            procedure, the patient's blood pressure, pulse, and                            oxygen saturations were monitored  continuously. The                            [id number]) scope was introduced through                            the anus and advanced to the the cecum, identified                            by appendiceal orifice and ileocecal valve. The                            colonoscopy was performed without difficulty. The                            patient tolerated the procedure well. The quality                            of the bowel preparation was good. The ileocecal                            valve, appendiceal orifice, and rectum were                            photographed. Scope In: 9:29:57 AM Scope Out: 9:47:32 AM Scope Withdrawal Time: 0 hours 11 minutes 53 seconds  Total Procedure Duration: 0 hours 17 minutes 35 seconds  Findings:      The perianal and digital rectal examinations were normal.      The colon (entire examined portion) appeared normal.      External and internal hemorrhoids were found during retroflexion. The       hemorrhoids were medium-sized. Impression:               - The entire examined colon is normal.                           - External and internal hemorrhoids.                           - No specimens collected. Moderate Sedation:      Moderate (conscious) sedation was administered by the endoscopy nurse       and supervised by the endoscopist. The following parameters were  monitored: oxygen saturation, heart rate, blood pressure, CO2       capnography and response to care. Total physician intraservice time was       23 minutes. Recommendation:           - Patient has a contact number available for                            emergencies. The signs and symptoms of potential                            delayed complications were discussed with the                            patient. Return to normal activities tomorrow.                            Written discharge instructions were provided to the                            patient.                            - Resume previous diet today.                           - Continue present medications.                           - Repeat colonoscopy in 10 years for screening                            purposes. Procedure Code(s):        --- Professional ---                           [phone], Colonoscopy, flexible; diagnostic, including                            collection of specimen(s) by brushing or washing,                            when performed (separate procedure)                           99153, Moderate sedation; each additional 15                            minutes intraservice time                           G0500, Moderate sedation services provided by the                            same physician or other qualified health care  professional performing a gastrointestinal                            endoscopic service that sedation supports,                            requiring the presence of an independent trained                            observer to assist in the monitoring of the                            patient's level of consciousness and physiological                            status; initial 15 minutes of intra-service time;                            patient age 61 years or older (additional time may                            be reported with [phone], as appropriate) Diagnosis Code(s):        --- Professional ---                           Z12.11, Encounter for screening for malignant                            neoplasm of colon                           K64.8, Other hemorrhoids CPT copyright 2019 American Medical Association. All rights reserved. The codes documented in this report are preliminary and upon coder review may  be revised to meet current compliance requirements. Hildred Laser, MD Hildred Laser, MD 06/23/2021 9:56:31 AM This report has been signed electronically. Number of Addenda: 0

## 2021-06-24 ENCOUNTER — Other Ambulatory Visit (HOSPITAL_COMMUNITY): Payer: Self-pay | Admitting: Adult Health

## 2021-06-24 DIAGNOSIS — Z1231 Encounter for screening mammogram for malignant neoplasm of breast: Secondary | ICD-10-CM

## 2021-06-27 ENCOUNTER — Encounter (INDEPENDENT_AMBULATORY_CARE_PROVIDER_SITE_OTHER): Payer: Self-pay | Admitting: *Deleted

## 2021-06-29 ENCOUNTER — Encounter (HOSPITAL_COMMUNITY): Payer: Self-pay | Admitting: Internal Medicine

## 2021-06-30 ENCOUNTER — Encounter: Payer: Self-pay | Admitting: Emergency Medicine

## 2021-06-30 ENCOUNTER — Ambulatory Visit
Admission: EM | Admit: 2021-06-30 | Discharge: 2021-06-30 | Disposition: A | Discharge status: Home / Self Care | Payer: Medicare HMO | Attending: Emergency Medicine | Admitting: Emergency Medicine

## 2021-06-30 ENCOUNTER — Other Ambulatory Visit: Payer: Self-pay

## 2021-06-30 DIAGNOSIS — R0981 Nasal congestion: Secondary | ICD-10-CM

## 2021-06-30 MED ORDER — AMOXICILLIN-POT CLAVULANATE 875-125 MG PO TABS: 1.0000 | ORAL_TABLET | Freq: Two times a day (BID) | ORAL | 0 refills | Status: AC

## 2021-06-30 NOTE — ED Triage Notes (Signed)
Sore throat, ear pain (feels like ears are blocked), nasal congestion since Sunday

## 2021-06-30 NOTE — ED Provider Notes (Signed)
[place]   [id number] 06/30/21 Arrival Time: 1884   CC: nasal congestion  SUBJECTIVE: History from: patient  Diana Gregory is a 65 y.o. female who presents with sore throat, ear pain, nasal congestion x 4 days.  Denies sick exposure to COVID, flu or strep.  Has tried OTC medications without relief.  Denies aggravating factors.  Reports previous symptoms in the past with sinus infection.   Denies rhinorrhea, sore throat, SOB, wheezing, chest pain, nausea, changes in bowel or bladder habits.    ROS: As per HPI.  All other pertinent ROS negative.     Past Medical History:  Diagnosis Date   Blood in urine 01/15/2015   Hypothyroid 07/28/2013   Thyroid disease    Past Surgical History:  Procedure Laterality Date   COLONOSCOPY N/A 06/23/2021   Procedure: COLONOSCOPY;  Surgeon: Rogene Houston, MD;  Location: [facility];  Service: Endoscopy;  Laterality: N/A;  9:30   EYE SURGERY Bilateral    No Known Allergies No current facility-administered medications on file prior to encounter.   Current Outpatient Medications on File Prior to Encounter  Medication Sig Dispense Refill   cetirizine (ZYRTEC) 10 MG tablet Take 1 tablet (10 mg total) by mouth daily. (Patient taking differently: Take 10 mg by mouth daily as needed for allergies.) 30 tablet 0   cholecalciferol (VITAMIN D) 1000 UNITS tablet Take 2,000 Units by mouth daily.     Multiple Vitamins-Minerals (MULTIVITAMIN WITH MINERALS) tablet Take 1 tablet by mouth daily.     SYNTHROID 50 MCG tablet TAKE ONE TABLET ONCE DAILY (Patient taking differently: Take 50 mcg by mouth daily before breakfast. TAKE ONE TABLET ONCE DAILY) 30 tablet 12   Social History   Socioeconomic History   Marital status: Married    Spouse name: Not on file   Number of children: Not on file   Years of education: Not on file   Highest education level: Not on file  Occupational History   Not on file  Tobacco Use   Smoking status: Never    Smokeless tobacco: Never  Vaping Use   Vaping Use: Never used  Substance and Sexual Activity   Alcohol use: Yes    Alcohol/week: 10.0 standard drinks    Types: 10 Cans of beer per week    Comment: occassional   Drug use: No   Sexual activity: Not Currently    Birth control/protection: Post-menopausal  Other Topics Concern   Not on file  Social History Narrative   Not on file   Social Determinants of Health   Financial Resource Strain: Not on file  Food Insecurity: Not on file  Transportation Needs: Not on file  Physical Activity: Not on file  Stress: Not on file  Social Connections: Not on file  Intimate Partner Violence: Not on file   Family History  Problem Relation Age of Onset   Hyperlipidemia Mother    Heart disease Father    Cancer Father        prostate   Hyperlipidemia Father    Cancer Maternal Aunt 5       breast    Heart disease Paternal Grandmother    Heart disease Paternal Grandfather     OBJECTIVE:  Vitals:   06/30/21 0926  Pulse: 100  Resp: 16  Temp: 98.9 F (37.2 C)  TempSrc: Oral  SpO2: 95%     General appearance: alert; appears mildly fatigued, but nontoxic; speaking in full sentences and tolerating own secretions HEENT:  NCAT; Ears: EACs clear, TMs pearly gray; Eyes: PERRL.  EOM grossly intact. Nose: nares patent without rhinorrhea, Throat: oropharynx clear, tonsils non erythematous or enlarged, uvula midline  Neck: supple without LAD Lungs: unlabored respirations, symmetrical air entry; cough: absent; no respiratory distress; CTAB Heart: regular rate and rhythm.  Skin: warm and dry Psychological: alert and cooperative; normal mood and affect   ASSESSMENT & PLAN:  1. Sinus congestion     Meds ordered this encounter  Medications   amoxicillin-clavulanate (AUGMENTIN) 875-125 MG tablet    Sig: Take 1 tablet by mouth every 12 (twelve) hours for 10 days.    Dispense:  20 tablet    Refill:  0    Order Specific Question:   Supervising  Provider    Answer:   Raylene Everts [[id number]]   Get plenty of rest and push fluids Augmentin for sinus infection Use OTC zyrtec for nasal congestion, runny nose, and/or sore throat Use OTC flonase for nasal congestion and runny nose Use medications daily for symptom relief Use OTC medications like ibuprofen or tylenol as needed fever or pain Call or go to the ED if you have any new or worsening symptoms such as fever, cough, shortness of breath, chest tightness, chest pain, turning blue, changes in mental status, etc...   Reviewed expectations re: course of current medical issues. Questions answered. Outlined signs and symptoms indicating need for more acute intervention. Patient verbalized understanding. After Visit Summary given.          Lestine Box, PA-C 06/30/21 [phone]

## 2021-06-30 NOTE — Discharge Instructions (Signed)
Get plenty of rest and push fluids Augmentin for sinus infection Use OTC zyrtec for nasal congestion, runny nose, and/or sore throat Use OTC flonase for nasal congestion and runny nose Use medications daily for symptom relief Use OTC medications like ibuprofen or tylenol as needed fever or pain Call or go to the ED if you have any new or worsening symptoms such as fever, cough, shortness of breath, chest tightness, chest pain, turning blue, changes in mental status, etc..Marland Kitchen

## 2021-08-01 ENCOUNTER — Ambulatory Visit (HOSPITAL_COMMUNITY): Payer: Medicare HMO

## 2021-08-04 ENCOUNTER — Other Ambulatory Visit: Payer: Self-pay

## 2021-08-04 ENCOUNTER — Ambulatory Visit (HOSPITAL_COMMUNITY)
Admission: RE | Admit: 2021-08-04 | Discharge: 2021-08-04 | Disposition: A | Discharge status: Home / Self Care | Payer: Medicare HMO | Source: Ambulatory Visit | Attending: Adult Health | Admitting: Adult Health

## 2021-08-04 DIAGNOSIS — Z1231 Encounter for screening mammogram for malignant neoplasm of breast: Secondary | ICD-10-CM | POA: Insufficient documentation

## 2021-08-24 ENCOUNTER — Other Ambulatory Visit: Payer: Self-pay | Admitting: Adult Health

## 2021-10-22 ENCOUNTER — Other Ambulatory Visit: Payer: Self-pay | Admitting: Adult Health

## 2021-11-30 DIAGNOSIS — G4700 Insomnia, unspecified: Secondary | ICD-10-CM | POA: Diagnosis not present

## 2021-12-22 ENCOUNTER — Other Ambulatory Visit: Payer: Self-pay | Admitting: Adult Health

## 2022-02-01 ENCOUNTER — Encounter: Payer: Self-pay | Admitting: Adult Health

## 2022-02-01 ENCOUNTER — Ambulatory Visit (INDEPENDENT_AMBULATORY_CARE_PROVIDER_SITE_OTHER): Payer: Medicare HMO | Admitting: Adult Health

## 2022-02-01 ENCOUNTER — Other Ambulatory Visit (HOSPITAL_COMMUNITY)
Admission: RE | Admit: 2022-02-01 | Discharge: 2022-02-01 | Disposition: A | Discharge status: Home / Self Care | Payer: Medicare HMO | Source: Ambulatory Visit | Attending: Adult Health | Admitting: Adult Health

## 2022-02-01 VITALS — BP 106/72 | HR 95 | Ht 63.0 in | Wt 120.0 lb

## 2022-02-01 DIAGNOSIS — Z13220 Encounter for screening for lipoid disorders: Secondary | ICD-10-CM

## 2022-02-01 DIAGNOSIS — E039 Hypothyroidism, unspecified: Secondary | ICD-10-CM

## 2022-02-01 DIAGNOSIS — Z780 Asymptomatic menopausal state: Secondary | ICD-10-CM

## 2022-02-01 DIAGNOSIS — Z01419 Encounter for gynecological examination (general) (routine) without abnormal findings: Secondary | ICD-10-CM | POA: Insufficient documentation

## 2022-02-01 DIAGNOSIS — Z1151 Encounter for screening for human papillomavirus (HPV): Secondary | ICD-10-CM | POA: Diagnosis not present

## 2022-02-01 DIAGNOSIS — R69 Illness, unspecified: Secondary | ICD-10-CM | POA: Diagnosis not present

## 2022-02-01 NOTE — Progress Notes (Signed)
Patient ID: Diana Gregory, female   DOB: 1956/08/16, 66 y.o.   MRN: [id number] ?History of Present Illness: ?Diana Gregory is a 66 year old white female,married, PM in for well woman gyn exam and pap. She is active.  ?PCP is Dr Gerarda Fraction. ? ? ?Current Medications, Allergies, Past Medical History, Past Surgical History, Family History and Social History were reviewed in [employer] record.   ? ? ?Review of Systems: ?Patient denies any headaches, hearing loss, fatigue, blurred vision, shortness of breath, chest pain, abdominal pain, problems with bowel movements, urination, or intercourse.(Not often). No joint pain or mood swings.  ?Hair seems to be thinner  ?Denis any vaginal bleeding ? ?Physical Exam:BP 106/72 (BP Location: Left Arm, Patient Position: Sitting, Cuff Size: Normal)   Pulse 95   Ht '5\' 3"'$  (1.6 m)   Wt 120 lb (54.4 kg)   BMI 21.26 kg/m?   ?General:  Well developed, well nourished, no acute distress ?Skin:  Warm and dry ?Neck:  Midline trachea, normal thyroid, good ROM, no lymphadenopathy,no carotid bruits heard  ?Lungs; Clear to auscultation bilaterally ?Breast:  No dominant palpable mass, retraction, or nipple discharge ?Cardiovascular: Regular rate and rhythm ?Abdomen:  Soft, non tender, no hepatosplenomegaly ?Pelvic:  External genitalia is normal in appearance, no lesions.  The vagina is pale with loss of rugae. Urethra has no lesions or masses. The cervix is smooth stenotic, pap with HR HPV genotyping performed. Marland Kitchen  Uterus is felt to be normal size, shape, and contour.  No adnexal masses or tenderness noted.Bladder is non tender, no masses felt. ?Rectal: Deferred  ?Extremities/musculoskeletal:  No swelling or varicosities noted, no clubbing or cyanosis ?Psych:  No mood changes, alert and cooperative,seems happy ?AA is 5 ?Fall risk is low ? ?  02/01/2022  ?  2:43 PM 08/10/2017  ?  9:37 AM 08/08/2016  ? 10:35 AM  ?Depression screen PHQ 2/9  ?Decreased Interest 0 0 0  ?Down, Depressed, Hopeless  0 0 0  ?PHQ - 2 Score 0 0 0  ?Altered sleeping 3    ?Tired, decreased energy 0    ?Change in appetite 0    ?Feeling bad or failure about yourself  0    ?Trouble concentrating 0    ?Moving slowly or fidgety/restless 0    ?Suicidal thoughts 0    ?PHQ-9 Score 3    ?  ? ?  02/01/2022  ?  2:43 PM  ?GAD 7 : Generalized Anxiety Score  ?Nervous, Anxious, on Edge 0  ?Control/stop worrying 0  ?Worry too much - different things 0  ?Trouble relaxing 0  ?Restless 0  ?Easily annoyed or irritable 0  ?Afraid - awful might happen 0  ?Total GAD 7 Score 0  ? ?  ? Upstream - 02/01/22 1447   ? ?  ? Pregnancy Intention Screening  ? Does the patient want to become pregnant in the next year? N/A   ? Does the patient's partner want to become pregnant in the next year? N/A   ? Would the patient like to discuss contraceptive options today? N/A   ?  ? Contraception Wrap Up  ? Current Method No Method - Other Reason   postmenopausal  ? End Method No Method - Other Reason   postmenopausal  ? Contraception Counseling Provided No   ? ?  ?  ? ?  ? Co exam with Balinda Quails NP student  ? ?Impression and Plan: ?1. Encounter for gynecological examination with Papanicolaou smear of  cervix ?Pap sent, this can be last pap if normal ?Physical in 2 years  ?Will check labs  ?- Cytology - PAP( [place]) ?- CBC ?- Comprehensive metabolic panel ? ?2. Hypothyroidism, unspecified type ?- TSH ?- T4, free ? ?3. Screening cholesterol level ?- Lipid panel ? ?4. Postmenopause ?Will get DEXA in near future  ? ? ? ?  ?  ?

## 2022-02-06 LAB — CYTOLOGY - PAP
Comment: NEGATIVE
Diagnosis: NEGATIVE
High risk HPV: NEGATIVE

## 2022-02-07 DIAGNOSIS — Z13220 Encounter for screening for lipoid disorders: Secondary | ICD-10-CM | POA: Diagnosis not present

## 2022-02-07 DIAGNOSIS — Z01419 Encounter for gynecological examination (general) (routine) without abnormal findings: Secondary | ICD-10-CM | POA: Diagnosis not present

## 2022-02-07 DIAGNOSIS — E039 Hypothyroidism, unspecified: Secondary | ICD-10-CM | POA: Diagnosis not present

## 2022-02-08 LAB — COMPREHENSIVE METABOLIC PANEL
ALT: 14 IU/L (ref 0–32)
AST: 23 IU/L (ref 0–40)
Albumin/Globulin Ratio: 1.8 (ref 1.2–2.2)
Albumin: 4.6 g/dL (ref 3.8–4.8)
Alkaline Phosphatase: 70 IU/L (ref 44–121)
BUN/Creatinine Ratio: 16 (ref 12–28)
BUN: 10 mg/dL (ref 8–27)
Bilirubin Total: 0.3 mg/dL (ref 0.0–1.2)
CO2: 23 mmol/L (ref 20–29)
Calcium: 9.2 mg/dL (ref 8.7–10.3)
Chloride: 99 mmol/L (ref 96–106)
Creatinine, Ser: 0.61 mg/dL (ref 0.57–1.00)
Globulin, Total: 2.6 g/dL (ref 1.5–4.5)
Glucose: 96 mg/dL (ref 70–99)
Potassium: 3.8 mmol/L (ref 3.5–5.2)
Sodium: 138 mmol/L (ref 134–144)
Total Protein: 7.2 g/dL (ref 6.0–8.5)
eGFR: 99 mL/min/{1.73_m2} (ref >59)

## 2022-02-08 LAB — LIPID PANEL
Chol/HDL Ratio: 3.4 ratio (ref 0.0–4.4)
Cholesterol, Total: 199 mg/dL (ref 100–199)
HDL: 59 mg/dL (ref >39)
LDL Chol Calc (NIH): 126 mg/dL — ABNORMAL HIGH (ref 0–99)
Triglycerides: 78 mg/dL (ref 0–149)
VLDL Cholesterol Cal: 14 mg/dL (ref 5–40)

## 2022-02-08 LAB — CBC
Hematocrit: 41.8 % (ref 34.0–46.6)
Hemoglobin: 14.3 g/dL (ref 11.1–15.9)
MCH: 30.9 pg (ref 26.6–33.0)
MCHC: 34.2 g/dL (ref 31.5–35.7)
MCV: 90 fL (ref 79–97)
Platelets: 267 10*3/uL (ref 150–450)
RBC: 4.63 x10E6/uL (ref 3.77–5.28)
RDW: 13.1 % (ref 11.7–15.4)
WBC: 5.9 10*3/uL (ref 3.4–10.8)

## 2022-02-08 LAB — TSH: TSH: 2.18 u[IU]/mL (ref 0.450–4.500)

## 2022-02-08 LAB — T4, FREE: Free T4: 1.63 ng/dL (ref 0.82–1.77)

## 2022-02-09 DIAGNOSIS — D225 Melanocytic nevi of trunk: Secondary | ICD-10-CM | POA: Diagnosis not present

## 2022-02-09 DIAGNOSIS — Z1283 Encounter for screening for malignant neoplasm of skin: Secondary | ICD-10-CM | POA: Diagnosis not present

## 2022-02-17 DIAGNOSIS — J329 Chronic sinusitis, unspecified: Secondary | ICD-10-CM | POA: Diagnosis not present

## 2022-02-17 DIAGNOSIS — Z6820 Body mass index (BMI) 20.0-20.9, adult: Secondary | ICD-10-CM | POA: Diagnosis not present

## 2022-02-17 DIAGNOSIS — E039 Hypothyroidism, unspecified: Secondary | ICD-10-CM | POA: Diagnosis not present

## 2022-02-20 ENCOUNTER — Other Ambulatory Visit: Payer: Self-pay | Admitting: Adult Health

## 2022-03-13 DIAGNOSIS — H25043 Posterior subcapsular polar age-related cataract, bilateral: Secondary | ICD-10-CM | POA: Diagnosis not present

## 2022-05-29 DIAGNOSIS — H02834 Dermatochalasis of left upper eyelid: Secondary | ICD-10-CM | POA: Diagnosis not present

## 2022-05-29 DIAGNOSIS — H01001 Unspecified blepharitis right upper eyelid: Secondary | ICD-10-CM | POA: Diagnosis not present

## 2022-05-29 DIAGNOSIS — H26491 Other secondary cataract, right eye: Secondary | ICD-10-CM | POA: Diagnosis not present

## 2022-05-29 DIAGNOSIS — H02831 Dermatochalasis of right upper eyelid: Secondary | ICD-10-CM | POA: Diagnosis not present

## 2022-07-10 ENCOUNTER — Other Ambulatory Visit (HOSPITAL_COMMUNITY): Payer: Self-pay | Admitting: Adult Health

## 2022-07-10 DIAGNOSIS — Z1231 Encounter for screening mammogram for malignant neoplasm of breast: Secondary | ICD-10-CM

## 2022-08-09 ENCOUNTER — Ambulatory Visit (HOSPITAL_COMMUNITY)
Admission: RE | Admit: 2022-08-09 | Discharge: 2022-08-09 | Disposition: A | Discharge status: Home / Self Care | Payer: Medicare HMO | Source: Ambulatory Visit | Attending: Adult Health | Admitting: Adult Health

## 2022-08-09 DIAGNOSIS — Z1231 Encounter for screening mammogram for malignant neoplasm of breast: Secondary | ICD-10-CM | POA: Insufficient documentation

## 2022-09-18 ENCOUNTER — Other Ambulatory Visit: Payer: Self-pay | Admitting: Adult Health

## 2022-09-28 DIAGNOSIS — L821 Other seborrheic keratosis: Secondary | ICD-10-CM | POA: Diagnosis not present

## 2023-01-01 DIAGNOSIS — H04123 Dry eye syndrome of bilateral lacrimal glands: Secondary | ICD-10-CM | POA: Diagnosis not present

## 2023-01-30 DIAGNOSIS — H04001 Unspecified dacryoadenitis, right lacrimal gland: Secondary | ICD-10-CM | POA: Diagnosis not present

## 2023-02-05 ENCOUNTER — Ambulatory Visit (INDEPENDENT_AMBULATORY_CARE_PROVIDER_SITE_OTHER): Payer: Medicare HMO | Admitting: Adult Health

## 2023-02-05 ENCOUNTER — Encounter: Payer: Self-pay | Admitting: Adult Health

## 2023-02-05 VITALS — BP 101/71 | HR 84 | Ht 63.0 in | Wt 122.0 lb

## 2023-02-05 DIAGNOSIS — Z780 Asymptomatic menopausal state: Secondary | ICD-10-CM

## 2023-02-05 DIAGNOSIS — E039 Hypothyroidism, unspecified: Secondary | ICD-10-CM

## 2023-02-05 DIAGNOSIS — Z1211 Encounter for screening for malignant neoplasm of colon: Secondary | ICD-10-CM | POA: Diagnosis not present

## 2023-02-05 DIAGNOSIS — Z01419 Encounter for gynecological examination (general) (routine) without abnormal findings: Secondary | ICD-10-CM | POA: Diagnosis not present

## 2023-02-05 DIAGNOSIS — Z13220 Encounter for screening for lipoid disorders: Secondary | ICD-10-CM

## 2023-02-05 DIAGNOSIS — Z13820 Encounter for screening for osteoporosis: Secondary | ICD-10-CM

## 2023-02-05 LAB — HEMOCCULT GUIAC POC 1CARD (OFFICE): Fecal Occult Blood, POC: NEGATIVE

## 2023-02-05 NOTE — Progress Notes (Signed)
Patient ID: Diana Gregory, female   DOB: 1956-01-25, 67 y.o.   MRN: [id number] History of Present Illness:  Diana Gregory is a 67 year old white female, married, PM in for a well woman gyn exam. She plays pickle ball.   Last pap was negative HPV,NILM 02/01/22.  PCP is Dr Sherwood Gambler.  Current Medications, Allergies, Past Medical History, Past Surgical History, Family History and Social History were reviewed in [employer] electronic medical record.     Review of Systems: Patient denies any headaches, hearing loss, fatigue, blurred vision, shortness of breath, chest pain, abdominal pain, problems with bowel movements, urination, or intercourse(not active). No joint pain or mood swings.  She denies any vaginal bleeding   Physical Exam:BP 101/71 (BP Location: Left Arm, Patient Position: Sitting, Cuff Size: Normal)   Pulse 84   Ht 5\' 3"  (1.6 m)   Wt 122 lb (55.3 kg)   BMI 21.61 kg/m   General:  Well developed, well nourished, no acute distress Skin:  Warm and dry Neck:  Midline trachea, normal thyroid, good ROM, no lymphadenopathy,no carotid bruits heard Lungs; Clear to auscultation bilaterally Breast:  No dominant palpable mass, retraction, or nipple discharge Cardiovascular: Regular rate and rhythm Abdomen:  Soft, non tender, no hepatosplenomegaly Pelvic:  External genitalia is normal in appearance, no lesions.  The vagina is pale and atrophic. Urethra has no lesions or masses. The cervix is smooth.  Uterus is felt to be normal size, shape, and contour.  No adnexal masses or tenderness noted.Bladder is non tender, no masses felt. Rectal: Good sphincter tone, no polyps, + hemorrhoids felt.  Hemoccult negative. Extremities/musculoskeletal:  No swelling or varicosities noted, no clubbing or cyanosis Psych:  No mood changes, alert and cooperative,seems happy AA is 6 Fall risk is low    02/05/2023   11:42 AM 02/01/2022    2:43 PM 08/10/2017    9:37 AM  Depression screen PHQ 2/9  Decreased Interest  0 0 0  Down, Depressed, Hopeless 0 0 0  PHQ - 2 Score 0 0 0  Altered sleeping 0 3   Tired, decreased energy 0 0   Change in appetite 0 0   Feeling bad or failure about yourself  0 0   Trouble concentrating 0 0   Moving slowly or fidgety/restless 0 0   Suicidal thoughts 0 0   PHQ-9 Score 0 3        02/05/2023   11:42 AM 02/01/2022    2:43 PM  GAD 7 : Generalized Anxiety Score  Nervous, Anxious, on Edge 0 0  Control/stop worrying 0 0  Worry too much - different things 0 0  Trouble relaxing 0 0  Restless 0 0  Easily annoyed or irritable 0 0  Afraid - awful might happen 0 0  Total GAD 7 Score 0 0    Upstream - 02/05/23 1145       Pregnancy Intention Screening   Does the patient want to become pregnant in the next year? N/A    Does the patient's partner want to become pregnant in the next year? N/A    Would the patient like to discuss contraceptive options today? N/A      Contraception Wrap Up   Current Method Abstinence   PM   End Method Abstinence   PM   Contraception Counseling Provided No            Examination chaperoned by Malachy Mood LPN    Impression and Plan: 1. Encounter for  well woman exam with routine gynecological exam Physical in 2 years,can get with PCP if desired  No more paps needed  Will check labs  - CBC - Comprehensive metabolic panel Mammogram was negative 08/09/22 Colonoscopy per GI  2. Encounter for screening fecal occult blood testing Hemoccult was negative  - POCT occult blood stool  3. Hypothyroidism, unspecified type Check labs Has refills on synthroid 50 mcg 1 daily  - TSH + free T4  4. Screening cholesterol level - Lipid panel  5. Postmenopause Will get Bone Density at [facility] 02/07/23 at 10:30 am, no calcium for 48 hours  - DG Bone Density; Future  6. Screening for osteoporosis Will get Bone Density at [facility] 02/07/23 at 10:30 am, no calcium for 48 hours  - DG Bone Density; Future

## 2023-02-07 ENCOUNTER — Ambulatory Visit (HOSPITAL_COMMUNITY)
Admission: RE | Admit: 2023-02-07 | Discharge: 2023-02-07 | Disposition: A | Discharge status: Home / Self Care | Payer: Medicare HMO | Source: Ambulatory Visit | Attending: Adult Health | Admitting: Adult Health

## 2023-02-07 DIAGNOSIS — Z780 Asymptomatic menopausal state: Secondary | ICD-10-CM | POA: Diagnosis not present

## 2023-02-07 DIAGNOSIS — M8589 Other specified disorders of bone density and structure, multiple sites: Secondary | ICD-10-CM | POA: Diagnosis not present

## 2023-02-07 DIAGNOSIS — Z13820 Encounter for screening for osteoporosis: Secondary | ICD-10-CM | POA: Insufficient documentation

## 2023-02-08 ENCOUNTER — Encounter: Payer: Self-pay | Admitting: Adult Health

## 2023-02-08 DIAGNOSIS — M8580 Other specified disorders of bone density and structure, unspecified site: Secondary | ICD-10-CM | POA: Insufficient documentation

## 2023-02-09 DIAGNOSIS — Z13220 Encounter for screening for lipoid disorders: Secondary | ICD-10-CM | POA: Diagnosis not present

## 2023-02-09 DIAGNOSIS — Z01419 Encounter for gynecological examination (general) (routine) without abnormal findings: Secondary | ICD-10-CM | POA: Diagnosis not present

## 2023-02-09 DIAGNOSIS — E039 Hypothyroidism, unspecified: Secondary | ICD-10-CM | POA: Diagnosis not present

## 2023-02-10 ENCOUNTER — Ambulatory Visit
Admission: EM | Admit: 2023-02-10 | Discharge: 2023-02-10 | Disposition: A | Discharge status: Home / Self Care | Payer: Medicare HMO | Attending: Family Medicine | Admitting: Family Medicine

## 2023-02-10 DIAGNOSIS — R0981 Nasal congestion: Secondary | ICD-10-CM | POA: Diagnosis not present

## 2023-02-10 DIAGNOSIS — J029 Acute pharyngitis, unspecified: Secondary | ICD-10-CM | POA: Diagnosis not present

## 2023-02-10 LAB — POCT RAPID STREP A (OFFICE): Rapid Strep A Screen: NEGATIVE

## 2023-02-10 LAB — CBC
Hematocrit: 40.2 % (ref 34.0–46.6)
Hemoglobin: 13.3 g/dL (ref 11.1–15.9)
MCH: 31.1 pg (ref 26.6–33.0)
MCHC: 33.1 g/dL (ref 31.5–35.7)
MCV: 94 fL (ref 79–97)
Platelets: 302 10*3/uL (ref 150–450)
RBC: 4.28 x10E6/uL (ref 3.77–5.28)
RDW: 13.2 % (ref 11.7–15.4)
WBC: 9.2 10*3/uL (ref 3.4–10.8)

## 2023-02-10 LAB — COMPREHENSIVE METABOLIC PANEL
ALT: 12 IU/L (ref 0–32)
AST: 17 IU/L (ref 0–40)
Albumin/Globulin Ratio: 1.8 (ref 1.2–2.2)
Albumin: 4.5 g/dL (ref 3.9–4.9)
Alkaline Phosphatase: 101 IU/L (ref 44–121)
BUN/Creatinine Ratio: 17 (ref 12–28)
BUN: 12 mg/dL (ref 8–27)
Bilirubin Total: 0.5 mg/dL (ref 0.0–1.2)
CO2: 22 mmol/L (ref 20–29)
Calcium: 9.6 mg/dL (ref 8.7–10.3)
Chloride: 99 mmol/L (ref 96–106)
Creatinine, Ser: 0.71 mg/dL (ref 0.57–1.00)
Globulin, Total: 2.5 g/dL (ref 1.5–4.5)
Glucose: 96 mg/dL (ref 70–99)
Potassium: 4.6 mmol/L (ref 3.5–5.2)
Sodium: 138 mmol/L (ref 134–144)
Total Protein: 7 g/dL (ref 6.0–8.5)
eGFR: 93 mL/min/{1.73_m2} (ref >59)

## 2023-02-10 LAB — LIPID PANEL
Chol/HDL Ratio: 2.9 ratio (ref 0.0–4.4)
Cholesterol, Total: 213 mg/dL — ABNORMAL HIGH (ref 100–199)
HDL: 74 mg/dL (ref >39)
LDL Chol Calc (NIH): 127 mg/dL — ABNORMAL HIGH (ref 0–99)
Triglycerides: 70 mg/dL (ref 0–149)
VLDL Cholesterol Cal: 12 mg/dL (ref 5–40)

## 2023-02-10 LAB — TSH+FREE T4
Free T4: 1.4 ng/dL (ref 0.82–1.77)
TSH: 1.54 u[IU]/mL (ref 0.450–4.500)

## 2023-02-10 MED ORDER — PREDNISONE 20 MG PO TABS: 40.0000 mg | ORAL_TABLET | Freq: Every day | ORAL | 0 refills | Status: AC

## 2023-02-10 MED ORDER — AMOXICILLIN 875 MG PO TABS: 875.0000 mg | ORAL_TABLET | Freq: Two times a day (BID) | ORAL | 0 refills | Status: AC

## 2023-02-10 NOTE — Discharge Instructions (Addendum)
You may use over the counter ibuprofen or acetaminophen as needed.  For a sore throat, over the counter products such as [employer] Peroxyl Mouth Sore Rinse or Chloraseptic Sore Throat Spray may provide some temporary relief. Your rapid strep test was negative today. We have sent your throat swab for culture and will let you know of any positive results. 

## 2023-02-10 NOTE — ED Provider Notes (Signed)
[facility]   [id number] 02/10/23 Arrival Time: 0803  ASSESSMENT & PLAN:  1. Sore throat   2. Nasal congestion     Meds ordered this encounter  Medications   predniSONE (DELTASONE) 20 MG tablet    Sig: Take 2 tablets (40 mg total) by mouth daily.    Dispense:  10 tablet    Refill:  0   amoxicillin (AMOXIL) 875 MG tablet    Sig: Take 1 tablet (875 mg total) by mouth 2 (two) times daily for 10 days.    Dispense:  20 tablet    Refill:  0   Start prednisone. If not improving in 3 days, will start antibiotic.  Discussed typical duration of symptoms. OTC symptom care as needed. Ensure adequate fluid intake and rest.   Follow-up Information     Elfredia Nevins, MD.   Specialty: Internal Medicine Why: If worsening or failing to improve as anticipated. Contact information: [address] [place] [address] [phone]                 Reviewed expectations re: course of current medical issues. Questions answered. Outlined signs and symptoms indicating need for more acute intervention. Patient verbalized understanding. After Visit Summary given.   SUBJECTIVE: History from: patient.  Diana Gregory is a 67 y.o. female who presents with complaint of nasal congestion, post-nasal drainage, and sinus pressure and ST. Onset gradual,  4 d ago . Respiratory symptoms: none that are significant. Fever: absent. Overall normal PO intake without n/v. OTC treatment: OTC allergy without much help. Seasonal allergies: mild. No specific aggravating or alleviating factors reported. Social History   Tobacco Use  Smoking Status Never  Smokeless Tobacco Never    OBJECTIVE:  Vitals:   02/10/23 0818  BP: 113/77  Pulse: 90  Resp: 20  Temp: 99.2 F (37.3 C)  TempSrc: Oral  SpO2: 96%     General appearance: alert; no distress HEENT: nasal congestion; clear runny nose; throat irritation secondary to post-nasal drainage; bilateral mild maxillary tenderness  to palpation Neck: supple without LAD; trachea midline Lungs: unlabored respirations, symmetrical air entry; cough: absent; no respiratory distress Skin: warm and dry Psychological: alert and cooperative; normal mood and affect  No Known Allergies  Past Medical History:  Diagnosis Date   Blood in urine 01/15/2015   Hypothyroid 07/28/2013   Thyroid disease    Family History  Problem Relation Age of Onset   Hyperlipidemia Mother    Heart disease Father    Cancer Father        prostate   Hyperlipidemia Father    Cancer Maternal Aunt 39       breast    Heart disease Paternal Grandmother    Heart disease Paternal Grandfather    Social History   Socioeconomic History   Marital status: Married    Spouse name: Not on file   Number of children: Not on file   Years of education: Not on file   Highest education level: Not on file  Occupational History   Not on file  Tobacco Use   Smoking status: Never   Smokeless tobacco: Never  Vaping Use   Vaping Use: Never used  Substance and Sexual Activity   Alcohol use: Yes    Alcohol/week: 10.0 standard drinks of alcohol    Types: 10 Cans of beer per week    Comment: occassional   Drug use: No   Sexual activity: Not Currently    Birth control/protection: Post-menopausal  Other Topics Concern   Not on file  Social History Narrative   Not on file   Social Determinants of Health   Financial Resource Strain: Low Risk  (02/05/2023)   Overall Financial Resource Strain (CARDIA)    Difficulty of Paying Living Expenses: Not hard at all  Food Insecurity: No Food Insecurity (02/05/2023)   Hunger Vital Sign    Worried About Running Out of Food in the Last Year: Never true    Ran Out of Food in the Last Year: Never true  Transportation Needs: No Transportation Needs (02/05/2023)   PRAPARE - Administrator, Civil Service (Medical): No    Lack of Transportation (Non-Medical): No  Physical Activity: Sufficiently Active (02/05/2023)    Exercise Vital Sign    Days of Exercise per Week: 3 days    Minutes of Exercise per Session: 100 min  Stress: No Stress Concern Present (02/05/2023)   [employer] of Occupational Health - Occupational Stress Questionnaire    Feeling of Stress : Not at all  Social Connections: Socially Integrated (02/05/2023)   Social Connection and Isolation Panel [NHANES]    Frequency of Communication with Friends and Family: More than three times a week    Frequency of Social Gatherings with Friends and Family: More than three times a week    Attends Religious Services: More than 4 times per year    Active Member of [employer] or Organizations: Yes    Attends Banker Meetings: More than 4 times per year    Marital Status: Married  Catering manager Violence: Not At Risk (02/05/2023)   Humiliation, Afraid, Rape, and Kick questionnaire    Fear of Current or Ex-Partner: No    Emotionally Abused: No    Physically Abused: No    Sexually Abused: No             Mardella Layman, MD 02/10/23 [phone]

## 2023-02-10 NOTE — ED Triage Notes (Signed)
Pt complains of a sore throat, sore sinuses, and swollen glands in throat x 4 days. Took ibuprofen and allergy med but no relief.

## 2023-02-11 LAB — CULTURE, GROUP A STREP (THRC)

## 2023-02-12 ENCOUNTER — Other Ambulatory Visit: Payer: Self-pay | Admitting: Adult Health

## 2023-02-12 MED ORDER — LEVOTHYROXINE SODIUM 50 MCG PO TABS: 50.0000 ug | ORAL_TABLET | Freq: Every day | ORAL | 12 refills | Status: DC

## 2023-02-13 LAB — CULTURE, GROUP A STREP (THRC)

## 2023-05-30 DIAGNOSIS — E039 Hypothyroidism, unspecified: Secondary | ICD-10-CM | POA: Diagnosis not present

## 2023-05-30 DIAGNOSIS — Z6821 Body mass index (BMI) 21.0-21.9, adult: Secondary | ICD-10-CM | POA: Diagnosis not present

## 2023-05-30 DIAGNOSIS — G4700 Insomnia, unspecified: Secondary | ICD-10-CM | POA: Diagnosis not present

## 2023-05-30 DIAGNOSIS — Z1331 Encounter for screening for depression: Secondary | ICD-10-CM | POA: Diagnosis not present

## 2023-05-30 DIAGNOSIS — I872 Venous insufficiency (chronic) (peripheral): Secondary | ICD-10-CM | POA: Diagnosis not present

## 2023-05-30 DIAGNOSIS — Z0001 Encounter for general adult medical examination with abnormal findings: Secondary | ICD-10-CM | POA: Diagnosis not present

## 2024-01-08 ENCOUNTER — Other Ambulatory Visit (HOSPITAL_COMMUNITY): Payer: Self-pay | Admitting: Adult Health

## 2024-01-08 DIAGNOSIS — Z1231 Encounter for screening mammogram for malignant neoplasm of breast: Secondary | ICD-10-CM

## 2024-01-24 ENCOUNTER — Ambulatory Visit (HOSPITAL_COMMUNITY)
Admission: RE | Admit: 2024-01-24 | Discharge: 2024-01-24 | Disposition: A | Discharge status: Home / Self Care | Source: Ambulatory Visit | Attending: Adult Health | Admitting: Adult Health

## 2024-01-24 ENCOUNTER — Encounter (HOSPITAL_COMMUNITY): Payer: Self-pay

## 2024-01-24 DIAGNOSIS — Z1231 Encounter for screening mammogram for malignant neoplasm of breast: Secondary | ICD-10-CM | POA: Insufficient documentation

## 2024-02-04 DIAGNOSIS — H04123 Dry eye syndrome of bilateral lacrimal glands: Secondary | ICD-10-CM | POA: Diagnosis not present

## 2024-02-06 ENCOUNTER — Other Ambulatory Visit: Payer: Self-pay | Admitting: Adult Health

## 2024-07-17 DIAGNOSIS — L821 Other seborrheic keratosis: Secondary | ICD-10-CM | POA: Diagnosis not present

## 2024-07-17 DIAGNOSIS — D225 Melanocytic nevi of trunk: Secondary | ICD-10-CM | POA: Diagnosis not present

## 2024-07-17 DIAGNOSIS — L570 Actinic keratosis: Secondary | ICD-10-CM | POA: Diagnosis not present

## 2024-07-17 DIAGNOSIS — X32XXXD Exposure to sunlight, subsequent encounter: Secondary | ICD-10-CM | POA: Diagnosis not present

## 2024-08-22 ENCOUNTER — Ambulatory Visit
Admission: EM | Admit: 2024-08-22 | Discharge: 2024-08-22 | Disposition: A | Discharge status: Home / Self Care | Attending: Family Medicine | Admitting: Family Medicine

## 2024-08-22 DIAGNOSIS — J069 Acute upper respiratory infection, unspecified: Secondary | ICD-10-CM

## 2024-08-22 MED ORDER — AZELASTINE HCL 0.1 % NA SOLN: 1.0000 | Freq: Two times a day (BID) | NASAL | 0 refills | Status: AC

## 2024-08-22 MED ORDER — PROMETHAZINE-DM 6.25-15 MG/5ML PO SYRP: 5.0000 mL | ORAL_SOLUTION | Freq: Four times a day (QID) | ORAL | 0 refills | Status: AC | PRN

## 2024-08-22 NOTE — Discharge Instructions (Signed)
 In addition to the prescribed medications, you may take over-the-counter cold and congestion medications, plain Mucinex, use Flonase  nasal spray once to twice daily, saline sinus rinses, humidifiers, drink plenty of fluids and get lots of rest.  Follow-up for severe worsening symptoms

## 2024-08-22 NOTE — ED Triage Notes (Signed)
 Pt reports she has a cough, sore throat , chest discomrt, and sinus drainage x 2 days   Took ibuprofen

## 2024-08-24 NOTE — ED Provider Notes (Signed)
 [facility]    CSN: [id number] Arrival date & time: 08/22/24  0805      History   Chief Complaint No chief complaint on file.   HPI Diana Gregory is a 68 y.o. female.   Patient presenting today with 2-day history of cough, sore throat, chest tightness, sinus drainage.  Denies fever, chills, chest pain, shortness of breath, vomiting, diarrhea.  So far trying ibuprofen with minimal relief.  No known history of chronic pulmonary disease.    Past Medical History:  Diagnosis Date   Blood in urine 01/15/2015   Hypothyroid 07/28/2013   Thyroid  disease     Patient Active Problem List   Diagnosis Date Noted   Osteopenia 02/08/2023   Encounter for screening fecal occult blood testing 02/05/2023   Encounter for well woman exam with routine gynecological exam 02/05/2023   Encounter for gynecological examination with Papanicolaou smear of cervix 02/01/2022   Screening cholesterol level 02/01/2022   Postmenopause 02/01/2022   Well woman exam with routine gynecological exam 08/10/2017   Blood in urine 01/15/2015   Hypothyroidism 07/28/2013    Past Surgical History:  Procedure Laterality Date   COLONOSCOPY N/A 06/23/2021   Procedure: COLONOSCOPY;  Surgeon: Golda Claudis PENNER, MD;  Location: [facility];  Service: Endoscopy;  Laterality: N/A;  9:30   EYE SURGERY Bilateral     OB History     Gravida  2   Para  2   Term  2   Preterm      AB      Living  2      SAB      IAB      Ectopic      Multiple      Live Births  2            Home Medications    Prior to Admission medications   Medication Sig Start Date End Date Taking? Authorizing Provider  azelastine (ASTELIN) 0.1 % nasal spray Place 1 spray into both nostrils 2 (two) times daily. Use in each nostril as directed 08/22/24  Yes Stuart Vernell Norris, PA-C  promethazine-dextromethorphan (PROMETHAZINE-DM) 6.25-15 MG/5ML syrup Take 5 mLs by mouth 4 (four) times daily as needed. 08/22/24   Yes Stuart Vernell Norris, PA-C  cetirizine  (ZYRTEC ) 10 MG tablet Take 1 tablet (10 mg total) by mouth daily. Patient taking differently: Take 10 mg by mouth daily as needed for allergies. 03/06/20   Wurst, Brittany, PA-C  cholecalciferol (VITAMIN D ) 1000 UNITS tablet Take 2,000 Units by mouth daily.    [provider]  Multiple Vitamins-Minerals (MULTIVITAMIN WITH MINERALS) tablet Take 1 tablet by mouth daily.    [provider]  Multiple Vitamins-Minerals (ZINC PO) Take by mouth.    [provider]  predniSONE  (DELTASONE ) 20 MG tablet Take 2 tablets (40 mg total) by mouth daily. 02/10/23   Rolinda Rogue, MD  SYNTHROID  50 MCG tablet TAKE ONE TABLET ( TOTAL) BY MOUTH DAILY 02/06/24   Signa Delon LABOR, NP    Family History Family History  Problem Relation Age of Onset   Hyperlipidemia Mother    Heart disease Father    Cancer Father        prostate   Hyperlipidemia Father    Breast cancer Maternal Aunt    Cancer Maternal Aunt 36       breast    Heart disease Paternal Grandmother    Heart disease Paternal Grandfather     Social History Social History  Tobacco Use   Smoking status: Never   Smokeless tobacco: Never  Vaping Use   Vaping status: Never Used  Substance Use Topics   Alcohol use: Yes    Alcohol/week: 10.0 standard drinks of alcohol    Types: 10 Cans of beer per week    Comment: occassional   Drug use: No     Allergies   Patient has no known allergies.   Review of Systems Review of Systems Per HPI  Physical Exam Triage Vital Signs ED Triage Vitals  Encounter Vitals Group     BP 08/22/24 0811 125/80     Girls Systolic BP Percentile --      Girls Diastolic BP Percentile --      Boys Systolic BP Percentile --      Boys Diastolic BP Percentile --      Pulse Rate 08/22/24 0811 83     Resp 08/22/24 0811 18     Temp 08/22/24 0811 98.1 F (36.7 C)     Temp Source 08/22/24 0811 Oral     SpO2 08/22/24 0811 97 %     Weight --       Height --      Head Circumference --      Peak Flow --      Pain Score 08/22/24 0848 0     Pain Loc --      Pain Education --      Exclude from Growth Chart --    No data found.  Updated Vital Signs BP 125/80 (BP Location: Right Arm)   Pulse 83   Temp 98.1 F (36.7 C) (Oral)   Resp 18   SpO2 97%   Visual Acuity Right Eye Distance:   Left Eye Distance:   Bilateral Distance:    Right Eye Near:   Left Eye Near:    Bilateral Near:     Physical Exam Vitals and nursing note reviewed.  Constitutional:      Appearance: Normal appearance.  HENT:     Head: Atraumatic.     Right Ear: Tympanic membrane and external ear normal.     Left Ear: Tympanic membrane and external ear normal.     Nose: Rhinorrhea present.     Mouth/Throat:     Mouth: Mucous membranes are moist.     Pharynx: Posterior oropharyngeal erythema present.  Eyes:     Extraocular Movements: Extraocular movements intact.     Conjunctiva/sclera: Conjunctivae normal.  Cardiovascular:     Rate and Rhythm: Normal rate and regular rhythm.     Heart sounds: Normal heart sounds.  Pulmonary:     Effort: Pulmonary effort is normal.     Breath sounds: Normal breath sounds. No wheezing or rales.  Musculoskeletal:        General: Normal range of motion.     Cervical back: Normal range of motion and neck supple.  Skin:    General: Skin is warm and dry.  Neurological:     Mental Status: She is alert and oriented to person, place, and time.  Psychiatric:        Mood and Affect: Mood normal.        Thought Content: Thought content normal.      UC Treatments / Results  Labs (all labs ordered are listed, but only abnormal results are displayed) Labs Reviewed - No data to display  EKG   Radiology No results found.  Procedures Procedures (including critical care time)  Medications Ordered in UC  Medications - No data to display  Initial Impression / Assessment and Plan / UC Course  I have reviewed  the triage vital signs and the nursing notes.  Pertinent labs & imaging results that were available during my care of the patient were reviewed by me and considered in my medical decision making (see chart for details).     Vital signs and exam very reassuring today, suspect viral respiratory infection.  Will treat with Astelin, Phenergan DM, supportive over-the-counter medications and home care.  Return for worsening or unresolving symptoms.  Final Clinical Impressions(s) / UC Diagnoses   Final diagnoses:  Viral URI with cough     Discharge Instructions      In addition to the prescribed medications, you may take over-the-counter cold and congestion medications, plain Mucinex, use Flonase  nasal spray once to twice daily, saline sinus rinses, humidifiers, drink plenty of fluids and get lots of rest.  Follow-up for severe worsening symptoms    ED Prescriptions     Medication Sig Dispense Auth. Provider   azelastine (ASTELIN) 0.1 % nasal spray Place 1 spray into both nostrils 2 (two) times daily. Use in each nostril as directed 30 mL Stuart Vernell Norris, PA-C   promethazine-dextromethorphan (PROMETHAZINE-DM) 6.25-15 MG/5ML syrup Take 5 mLs by mouth 4 (four) times daily as needed. 100 mL Stuart Vernell Norris, [place]      PDMP not reviewed this encounter.   Stuart Vernell [place], PA-C 08/24/24 [phone]
